# Patient Record
Sex: Female | Born: 1995 | Race: White | Hispanic: No | Marital: Single | Smoking: Current every day smoker
Health system: Southern US, Community
[De-identification: ages and names within clinical notes are randomized; demographics above are authoritative.]

## PROBLEM LIST (undated history)

## (undated) DIAGNOSIS — F319 Bipolar disorder, unspecified: Secondary | ICD-10-CM

## (undated) DIAGNOSIS — G479 Sleep disorder, unspecified: Secondary | ICD-10-CM

---

## 2017-05-04 ENCOUNTER — Other Ambulatory Visit: Payer: Self-pay

## 2017-05-04 ENCOUNTER — Ambulatory Visit (HOSPITAL_COMMUNITY)
Admission: EM | Admit: 2017-05-04 | Discharge: 2017-05-04 | Disposition: A | Discharge status: Home / Self Care | Payer: Medicare Other | Attending: Emergency Medicine | Admitting: Emergency Medicine

## 2017-05-04 ENCOUNTER — Encounter (HOSPITAL_COMMUNITY): Payer: Self-pay

## 2017-05-04 DIAGNOSIS — Z113 Encounter for screening for infections with a predominantly sexual mode of transmission: Secondary | ICD-10-CM

## 2017-05-04 DIAGNOSIS — Z202 Contact with and (suspected) exposure to infections with a predominantly sexual mode of transmission: Secondary | ICD-10-CM | POA: Diagnosis not present

## 2017-05-04 DIAGNOSIS — N926 Irregular menstruation, unspecified: Secondary | ICD-10-CM | POA: Insufficient documentation

## 2017-05-04 DIAGNOSIS — F17210 Nicotine dependence, cigarettes, uncomplicated: Secondary | ICD-10-CM | POA: Insufficient documentation

## 2017-05-04 DIAGNOSIS — N898 Other specified noninflammatory disorders of vagina: Secondary | ICD-10-CM | POA: Insufficient documentation

## 2017-05-04 DIAGNOSIS — Z79899 Other long term (current) drug therapy: Secondary | ICD-10-CM | POA: Diagnosis not present

## 2017-05-04 HISTORY — DX: Bipolar disorder, unspecified: F31.9

## 2017-05-04 HISTORY — DX: Sleep disorder, unspecified: G47.9

## 2017-05-04 LAB — POCT PREGNANCY, URINE: Preg Test, Ur: NEGATIVE

## 2017-05-04 LAB — POCT URINALYSIS DIP (DEVICE)
Bilirubin Urine: NEGATIVE
Glucose, UA: NEGATIVE mg/dL
Ketones, ur: NEGATIVE mg/dL
NITRITE: NEGATIVE
PH: 7.5 (ref 5.0–8.0)
PROTEIN: NEGATIVE mg/dL
Specific Gravity, Urine: 1.02 (ref 1.005–1.030)
Urobilinogen, UA: 0.2 mg/dL (ref 0.0–1.0)

## 2017-05-04 MED ORDER — AZITHROMYCIN 250 MG PO TABS
1000.0000 mg | ORAL_TABLET | Freq: Once | ORAL | Status: AC
  Administered 2017-05-04: 1000 mg via ORAL

## 2017-05-04 MED ORDER — METRONIDAZOLE 500 MG PO TABS: 500.0000 mg | ORAL_TABLET | Freq: Two times a day (BID) | ORAL | 0 refills | Status: AC

## 2017-05-04 MED ORDER — CEFTRIAXONE SODIUM 250 MG IJ SOLR
250.0000 mg | Freq: Once | INTRAMUSCULAR | Status: AC
  Administered 2017-05-04: 250 mg via INTRAMUSCULAR

## 2017-05-04 MED ORDER — LIDOCAINE HCL (PF) 1 % IJ SOLN
INTRAMUSCULAR | Status: AC
  Filled 2017-05-04: qty 2

## 2017-05-04 MED ORDER — CEFTRIAXONE SODIUM 250 MG IJ SOLR
INTRAMUSCULAR | Status: AC
  Filled 2017-05-04: qty 250

## 2017-05-04 MED ORDER — AZITHROMYCIN 250 MG PO TABS
ORAL_TABLET | ORAL | Status: AC
  Filled 2017-05-04: qty 4

## 2017-05-04 NOTE — ED Triage Notes (Signed)
Pt presents with yellow vaginal discharge and irritation x 1 month. Reports last time she had gonorrhea.

## 2017-05-04 NOTE — ED Provider Notes (Signed)
[facility]    CSN: [id number] Arrival date & time: 05/04/17  1001     History   Chief Complaint Chief Complaint  Patient presents with  . Vaginal Itching    HPI Lindsey Ramos is a 22 y.o. female.   22 year old female presents with yellowish vaginal discharge and itching for over 1 month. Also having a foul odor but denies any abdominal pain, pelvic pain, dysuria or unusual bleeding. She has a history of irregular periods and has not had a period since September 2018. She is also bipolar and on multiple medications for treatment. She has had 5 partners in the past 3 months and occasionally uses condoms. She has a history of gonorrhea/chlamydia in Jan 2019. She took 4 pills which cleared up her symptoms. She also had a pap smear in March. She has not taken any OTC medications for her current symptoms.   The history is provided by the patient.    Past Medical History:  Diagnosis Date  . Bipolar affective disorder (HCC)   . Sleep disorder     There are no active problems to display for this patient.   History reviewed. No pertinent surgical history.  OB History   None      Home Medications    Prior to Admission medications   Medication Sig Start Date End Date Taking? Authorizing Provider  diphenhydrAMINE (BENADRYL) 50 MG capsule Take 50 mg by mouth at bedtime as needed.   Yes [provider]  mirtazapine (REMERON) 30 MG tablet Take 30 mg by mouth at bedtime.   Yes [provider]  paliperidone (INVEGA SUSTENNA) 234 MG/1.5ML SUSY injection Inject 234 mg into the muscle once.   Yes [provider]  sertraline (ZOLOFT) 100 MG tablet Take 100 mg by mouth daily.   Yes [provider]  metroNIDAZOLE (FLAGYL) 500 MG tablet Take 1 tablet (500 mg total) by mouth 2 (two) times daily for 7 days. 05/04/17 05/11/17  Sudie Grumbling, NP    Family History Family History  Problem Relation Age of Onset  . Healthy Mother   . Healthy  Father     Social History Social History   Tobacco Use  . Smoking status: Current Every Day Smoker    Packs/day: 0.50    Years: 5.00    Pack years: 2.50    Types: Cigarettes  . Smokeless tobacco: Never Used  Substance Use Topics  . Alcohol use: Never    Frequency: Never  . Drug use: Never     Allergies   Patient has no known allergies.   Review of Systems Review of Systems  Constitutional: Negative for activity change, appetite change, chills, fatigue and fever.  HENT: Negative for mouth sores, sore throat and trouble swallowing.   Respiratory: Negative for cough, chest tightness and shortness of breath.   Gastrointestinal: Negative for abdominal pain, blood in stool, constipation, diarrhea, nausea and vomiting.  Genitourinary: Positive for menstrual problem and vaginal discharge. Negative for decreased urine volume, difficulty urinating, dysuria, flank pain, frequency, genital sores, hematuria, pelvic pain, urgency, vaginal bleeding and vaginal pain.  Musculoskeletal: Negative for arthralgias, back pain and myalgias.  Skin: Negative for rash and wound.  Neurological: Negative for dizziness, tremors, seizures, syncope, weakness, light-headedness, numbness and headaches.  Hematological: Negative for adenopathy. Does not bruise/bleed easily.  Psychiatric/Behavioral: Positive for dysphoric mood and sleep disturbance.     Physical Exam Triage Vital Signs ED Triage Vitals  Enc Vitals Group  BP 05/04/17 1024 101/60     Pulse Rate 05/04/17 1024 90     Resp 05/04/17 1024 18     Temp 05/04/17 1024 98.7 F (37.1 C)     Temp src --      SpO2 05/04/17 1024 94 %     Weight 05/04/17 1025 180 lb (81.6 kg)     Height --      Head Circumference --      Peak Flow --      Pain Score 05/04/17 1025 0     Pain Loc --      Pain Edu? --      Excl. in GC? --    No data found.  Updated Vital Signs BP 101/60   Pulse 90   Temp 98.7 F (37.1 C)   Resp 18   Wt 180 lb (81.6 kg)    LMP 09/29/2016   SpO2 94%   Visual Acuity Right Eye Distance:   Left Eye Distance:   Bilateral Distance:    Right Eye Near:   Left Eye Near:    Bilateral Near:     Physical Exam  Constitutional: She is oriented to person, place, and time. She appears well-developed and well-nourished. She is cooperative. She does not appear ill. No distress.  Patient sitting on exam table in no acute distress but flat affect.   HENT:  Head: Normocephalic and atraumatic.  Right Ear: External ear normal.  Left Ear: External ear normal.  Mouth/Throat: Oropharynx is clear and moist.  Eyes: Conjunctivae and EOM are normal.  Neck: Normal range of motion.  Cardiovascular: Normal rate, regular rhythm and normal heart sounds.  No murmur heard. Pulmonary/Chest: Effort normal and breath sounds normal. No respiratory distress. She has no decreased breath sounds. She has no wheezes. She has no rhonchi.  Abdominal: Soft. Normal appearance and bowel sounds are normal. There is no tenderness. There is no rigidity, no rebound, no guarding and no CVA tenderness.  Genitourinary:  Genitourinary Comments: Patient declines pelvic exam today  Neurological: She is alert and oriented to person, place, and time.  Skin: Skin is warm and dry. No rash noted.  Psychiatric: Judgment and thought content normal. Her speech is delayed. She is slowed. She exhibits a depressed mood.  Vitals reviewed.    UC Treatments / Results  Labs (all labs ordered are listed, but only abnormal results are displayed) Labs Reviewed  POCT URINALYSIS DIP (DEVICE) - Abnormal; Notable for the following components:      Result Value   Hgb urine dipstick TRACE (*)    Leukocytes, UA LARGE (*)    All other components within normal limits  URINE CULTURE  POCT PREGNANCY, URINE  URINE CYTOLOGY ANCILLARY ONLY    EKG None  Radiology No results found.  Procedures Procedures (including critical care time)  Medications Ordered in  UC Medications  azithromycin (ZITHROMAX) tablet 1,000 mg (1,000 mg Oral Given 05/04/17 1115)  cefTRIAXone (ROCEPHIN) injection 250 mg (250 mg Intramuscular Given 05/04/17 1115)    Initial Impression / Assessment and Plan / UC Course  I have reviewed the triage vital signs and the nursing notes.  Pertinent labs & imaging results that were available during my care of the patient were reviewed by me and considered in my medical decision making (see chart for details).    Reviewed negative pregnancy test with patient. Also reviewed urinalysis results- most likely due to vaginal infection but will send her urine for culture. Discussed that she  may have GC/Chlamydia and/or other STI's. Will treat with Zithromax and Rocephin today. Prescribed Flagyl  twice a day for possible BV. Will add additional medications pending lab results. Discussed no intercourse for at least 10 days. Follow-up pending lab results.  Final Clinical Impressions(s) / UC Diagnoses   Final diagnoses:  Foul smelling vaginal discharge  Potential exposure to STD     Discharge Instructions     You were given a shot of Rocephin (antibiotic) today to treat gonorrhea. You were also given pills (Zithromax) to treat Chlamydia. Recommend start Flagyl  twice a day for 7 days. Take with food and do not drink any alcohol while on medication. No sexual intercourse for at least 10 days. Encouraged to use condoms with each and every future sexual encounter. Follow-up pending lab results.     ED Prescriptions    Medication Sig Dispense Auth. Provider   metroNIDAZOLE (FLAGYL) 500 MG tablet Take 1 tablet (500 mg total) by mouth 2 (two) times daily for 7 days. 14 tablet Sudie Grumbling, NP     Controlled Substance Prescriptions [place] Controlled Substance Registry consulted? Not Applicable   Sudie Grumbling, NP 05/04/17 2100

## 2017-05-04 NOTE — Discharge Instructions (Addendum)
You were given a shot of Rocephin (antibiotic) today to treat gonorrhea. You were also given pills (Zithromax) to treat Chlamydia. Recommend start Flagyl  twice a day for 7 days. Take with food and do not drink any alcohol while on medication. No sexual intercourse for at least 10 days. Encouraged to use condoms with each and every future sexual encounter. Follow-up pending lab results.

## 2017-05-05 LAB — URINE CULTURE: Special Requests: NORMAL

## 2017-05-07 LAB — URINE CYTOLOGY ANCILLARY ONLY
CHLAMYDIA, DNA PROBE: NEGATIVE
NEISSERIA GONORRHEA: NEGATIVE
TRICH (WINDOWPATH): POSITIVE — AB

## 2017-05-08 NOTE — Progress Notes (Signed)
Trichomonas is positive. Rx metronidazole was given at the urgent care visit. Attempted to contact patient to inform of results. No answer, voicemail left. Need to educate patient to please refrain from sexual intercourse for 7 days to give the medicine time to work. Sexual partners need to be notified and tested/treated. Condoms may reduce risk of reinfection. Recheck for further evaluation if symptoms are not improving. Answered all questions.

## 2017-05-09 LAB — URINE CYTOLOGY ANCILLARY ONLY: Candida vaginitis: NEGATIVE

## 2017-05-11 ENCOUNTER — Telehealth (HOSPITAL_COMMUNITY): Payer: Self-pay

## 2017-05-11 NOTE — Telephone Encounter (Signed)
Attempted to reach patient x 2 regarding results. Group home director who is patient medical POA answered and given results of all tests.  Also, patient did test positive Bacterial Vaginosis, this was treated with Flagyl at the urgent care visit.

## 2017-05-14 ENCOUNTER — Emergency Department (HOSPITAL_COMMUNITY): Payer: Medicare Other

## 2017-05-14 ENCOUNTER — Other Ambulatory Visit: Payer: Self-pay

## 2017-05-14 ENCOUNTER — Encounter (HOSPITAL_COMMUNITY): Payer: Self-pay | Admitting: Emergency Medicine

## 2017-05-14 ENCOUNTER — Emergency Department (HOSPITAL_COMMUNITY)
Admission: EM | Admit: 2017-05-14 | Discharge: 2017-05-14 | Disposition: A | Discharge status: Home / Self Care | Payer: Medicare Other | Attending: Emergency Medicine | Admitting: Emergency Medicine

## 2017-05-14 DIAGNOSIS — Y998 Other external cause status: Secondary | ICD-10-CM | POA: Insufficient documentation

## 2017-05-14 DIAGNOSIS — S82851A Displaced trimalleolar fracture of right lower leg, initial encounter for closed fracture: Secondary | ICD-10-CM | POA: Diagnosis not present

## 2017-05-14 DIAGNOSIS — S52501A Unspecified fracture of the lower end of right radius, initial encounter for closed fracture: Secondary | ICD-10-CM | POA: Diagnosis not present

## 2017-05-14 DIAGNOSIS — Y9301 Activity, walking, marching and hiking: Secondary | ICD-10-CM | POA: Diagnosis not present

## 2017-05-14 DIAGNOSIS — F17210 Nicotine dependence, cigarettes, uncomplicated: Secondary | ICD-10-CM | POA: Insufficient documentation

## 2017-05-14 DIAGNOSIS — W108XXA Fall (on) (from) other stairs and steps, initial encounter: Secondary | ICD-10-CM | POA: Diagnosis not present

## 2017-05-14 DIAGNOSIS — S6991XA Unspecified injury of right wrist, hand and finger(s), initial encounter: Secondary | ICD-10-CM | POA: Diagnosis present

## 2017-05-14 DIAGNOSIS — Z79899 Other long term (current) drug therapy: Secondary | ICD-10-CM | POA: Insufficient documentation

## 2017-05-14 DIAGNOSIS — Y929 Unspecified place or not applicable: Secondary | ICD-10-CM | POA: Insufficient documentation

## 2017-05-14 MED ORDER — HYDROCODONE-ACETAMINOPHEN 5-325 MG PO TABS: 1.0000 | ORAL_TABLET | Freq: Four times a day (QID) | ORAL | 0 refills | Status: DC | PRN

## 2017-05-14 MED ORDER — FENTANYL CITRATE (PF) 100 MCG/2ML IJ SOLN
50.0000 ug | Freq: Once | INTRAMUSCULAR | Status: AC
Start: 2017-05-14 — End: 2017-05-14
  Administered 2017-05-14: 50 ug via INTRAVENOUS
  Filled 2017-05-14: qty 2

## 2017-05-14 MED ORDER — HYDROMORPHONE HCL 1 MG/ML IJ SOLN
1.0000 mg | Freq: Once | INTRAMUSCULAR | Status: AC
  Administered 2017-05-14: 1 mg via INTRAVENOUS
  Filled 2017-05-14: qty 1

## 2017-05-14 MED ORDER — PROPOFOL 10 MG/ML IV BOLUS
INTRAVENOUS | Status: AC | PRN
  Administered 2017-05-14 (×2): 30 mg via INTRAVENOUS
  Administered 2017-05-14: 50 mg via INTRAVENOUS
  Administered 2017-05-14: 20 mg via INTRAVENOUS

## 2017-05-14 MED ORDER — PROPOFOL 10 MG/ML IV BOLUS
0.5000 mg/kg | Freq: Once | INTRAVENOUS | Status: DC
  Filled 2017-05-14: qty 20

## 2017-05-14 MED ORDER — SODIUM CHLORIDE 0.9 % IV SOLN
INTRAVENOUS | Status: AC | PRN
  Administered 2017-05-14: 125 mL/h via INTRAVENOUS

## 2017-05-14 NOTE — ED Notes (Signed)
Spoke with Knox Saliva from her group home and she said that the patient is able to legally consent to procedures by herself.

## 2017-05-14 NOTE — Discharge Instructions (Signed)
Call and make an appointment to follow-up closely with the orthopedist.  Will arrange to have home care come to the residents to evaluate for wheelchair.  Keep right leg and arm elevated above your heart.  Take medication as prescribed.

## 2017-05-14 NOTE — ED Triage Notes (Signed)
Per EMS, pt fell up the stairs this afternoon and has an obvious ankle deformity. Right wrist pain. Pt is in splint.

## 2017-05-14 NOTE — Progress Notes (Signed)
This writer present at bedside during conscious sedation procedure.  Nasal cannula applied at 3lpm, HR112, rr16, spo2 100%.  Etco2 in place =41.  Pt tolerated procedure well without incident.

## 2017-05-14 NOTE — ED Provider Notes (Signed)
[facility]EM[facility]ENCY [facility]PT Provider Note   CSN: [id number] Arrival date & time: 05/14/17  1641     History   Chief Complaint Chief Complaint  Patient presents with  . Fall    HPI Lindsey Ramos is a 22 y.o. female.  HPI Patient states she fell down 6 stairs this afternoon.  States she slipped.  Denies any head or neck injury.  No loss of consciousness.  Unable to ambulate after fall.  Patient complains of right ankle and right wrist pain.  Denies any weakness or numbness.  Denies chest pain or abdominal pain.  Splint applied to the wrist and ankle by EMS. Past Medical History:  Diagnosis Date  . Bipolar affective disorder (HCC)   . Sleep disorder     There are no active problems to display for this patient.   History reviewed. No pertinent surgical history.   OB History   None      Home Medications    Prior to Admission medications   Medication Sig Start Date End Date Taking? Authorizing Provider  diphenhydrAMINE (BENADRYL) 50 MG capsule Take 50 mg by mouth at bedtime.    Yes [provider]  mirtazapine (REMERON) 30 MG tablet Take 30 mg by mouth at bedtime.   Yes [provider]  paliperidone (INVEGA SUSTENNA) 234 MG/1.5ML SUSY injection Inject 234 mg into the muscle every 30 (thirty) days.    Yes [provider]  Paliperidone (INVEGA) 1.5 MG TB24 Take 1.5 mg by mouth at bedtime.   Yes [provider]  sertraline (ZOLOFT) 100 MG tablet Take 100 mg by mouth daily.   Yes [provider]  HYDROcodone-acetaminophen (NORCO) 5-325 MG tablet Take 1-2 tablets by mouth every 6 (six) hours as needed for severe pain. 05/14/17   Loren Racer, MD    Family History Family History  Problem Relation Age of Onset  . Healthy Mother   . Healthy Father     Social History Social History   Tobacco Use  . Smoking status: Current Every Day Smoker    Packs/day: 0.50    Years: 5.00    Pack years: 2.50   Types: Cigarettes  . Smokeless tobacco: Never Used  Substance Use Topics  . Alcohol use: Never    Frequency: Never  . Drug use: Never     Allergies   Patient has no known allergies.   Review of Systems Review of Systems  HENT: Negative for facial swelling.   Eyes: Negative for visual disturbance.  Respiratory: Negative for shortness of breath.   Cardiovascular: Negative for chest pain.  Gastrointestinal: Negative for abdominal pain.  Musculoskeletal: Positive for arthralgias and joint swelling. Negative for back pain and neck pain.  Skin: Positive for wound. Negative for rash.  Neurological: Negative for dizziness, syncope, weakness, light-headedness, numbness and headaches.  All other systems reviewed and are negative.    Physical Exam Updated Vital Signs BP 127/77 (BP Location: Left Arm)   Pulse (!) 107   Temp 98.1 F (36.7 C) (Oral)   Resp 20   LMP 09/07/2016   SpO2 98%   Physical Exam  Constitutional: She is oriented to person, place, and time. She appears well-developed and well-nourished. No distress.  HENT:  Head: Normocephalic and atraumatic.  Mouth/Throat: Oropharynx is clear and moist.  No obvious scalp trauma.  Midface is stable.  No intraoral trauma.  Eyes: Pupils are equal, round, and reactive to light. EOM are normal.  Neck: Normal range of motion.  Neck supple.  No posterior midline cervical tenderness to palpation.  Cardiovascular: Normal rate and regular rhythm. Exam reveals no gallop and no friction rub.  No murmur heard. Pulmonary/Chest: Effort normal and breath sounds normal. No stridor. No respiratory distress. She has no wheezes. She has no rales. She exhibits no tenderness.  Abdominal: Soft. Bowel sounds are normal. There is no tenderness. There is no rebound and no guarding.  Musculoskeletal: Normal range of motion. She exhibits tenderness and deformity. She exhibits no edema.  Patient with right wrist tenderness to palpation especially over  the dorsum of the wrist.  There is no obvious swelling or deformity.  She has full range of motion of the fingers, elbow and shoulder without pain, swelling or tenderness.  Distal pulses are 2+.  Patient's pelvis is stable.  Full range of motion of the right hip and right knee without tenderness, swelling or deformity.  Swelling deformity of the right ankle with diffuse tenderness and tenderness of the right foot.  Good distal cap refill.  Small abrasion of the dorsum of the right foot.  Neurological: She is alert and oriented to person, place, and time.  5/5 motor in all extremities.  Sensation fully intact.  Skin: Skin is warm and dry. Capillary refill takes less than 2 seconds. No rash noted. She is not diaphoretic. No erythema.  Psychiatric: Her behavior is normal.  Flat affect.  Does not appear to be in obvious distress.  Nursing note and vitals reviewed.    ED Treatments / Results  Labs (all labs ordered are listed, but only abnormal results are displayed) Labs Reviewed - No data to display  EKG None  Radiology Dg Wrist Complete Right  Result Date: 05/14/2017 CLINICAL DATA:  Fall, pain. EXAM: RIGHT WRIST - COMPLETE 3+ VIEW COMPARISON:  None. FINDINGS: Displaced fracture of the distal RIGHT radius, with fracture extension to the articular surface (ulnar aspect). Approximately 5 mm greatest fracture diastasis. Distal ulna appears intact and normally aligned. Carpal bones appear intact and normally aligned. No dislocation seen at the radiocarpal joint space. IMPRESSION: Displaced fracture of the distal RIGHT radius, with extension to the articular surface (ulnar aspect). Electronically Signed   By: Bary Richard M.D.   On: 05/14/2017 18:37   Dg Ankle Complete Right  Result Date: 05/14/2017 CLINICAL DATA:  Right ankle fracture. EXAM: RIGHT ANKLE - COMPLETE 3+ VIEW COMPARISON:  Radiographs of same day. FINDINGS: Moderately displaced oblique fracture is seen involving the distal right fibula  which is unchanged compared to prior exam. Mildly displaced medial malleolar fracture is noted which is improved compared to prior exam. There appears to be improved alignment of talus compared to distal tibia. IMPRESSION: Status post casting and immobilization of distal tibial and fibular fractures. Electronically Signed   By: Lupita Raider, M.D.   On: 05/14/2017 21:55   Dg Ankle Complete Right  Result Date: 05/14/2017 CLINICAL DATA:  Fall, ankle pain. EXAM: RIGHT ANKLE - COMPLETE 3+ VIEW COMPARISON:  None. FINDINGS: Displaced fracture of the medial malleolus, with associated mild distortion of the ankle mortise (medial and anterior widening). Additional obliquely oriented fracture of the distal fibula, with approximately 4 mm greatest fracture displacement. Visualized portions of the hindfoot and midfoot appear intact and normally aligned. No fracture seen at the talar dome. Expected soft tissue swelling. IMPRESSION: 1. Displaced fracture of the medial malleolus. 2. Displaced fracture of the distal fibula. 3. Distortion of the ankle mortise, with medial and anterior widening, and at least mild  anterior displacement of the tibia relative to the underlying talar dome. Electronically Signed   By: Bary Richard M.D.   On: 05/14/2017 18:35   Dg Foot Complete Right  Result Date: 05/14/2017 CLINICAL DATA:  Fall, pain. EXAM: RIGHT FOOT COMPLETE - 3+ VIEW COMPARISON:  None. FINDINGS: Osseous structures of the RIGHT foot are intact and normally aligned throughout. Soft tissues about the RIGHT foot are unremarkable. Displaced fractures of the medial malleolus and distal fibula, as detailed on the plain film report of the RIGHT ankle. IMPRESSION: 1. No fracture or dislocation within the RIGHT foot. 2. Displaced fractures of the medial malleolus and distal fibula, as detailed on dedicated plain film report of the RIGHT ankle. Electronically Signed   By: Bary Richard M.D.   On: 05/14/2017 18:38     Procedures Reduction of fracture Date/Time: 05/14/2017 9:07 PM Performed by: Loren Racer, MD Authorized by: Loren Racer, MD  Consent: Written consent obtained. Risks and benefits: risks, benefits and alternatives were discussed Consent given by: patient Patient understanding: patient states understanding of the procedure being performed Patient consent: the patient's understanding of the procedure matches consent given Procedure consent: procedure consent matches procedure scheduled Relevant documents: relevant documents present and verified Imaging studies: imaging studies available Patient identity confirmed: arm band and verbally with patient Time out: Immediately prior to procedure a "time out" was called to verify the correct patient, procedure, equipment, support staff and site/side marked as required. Local anesthesia used: no  Anesthesia: Local anesthesia used: no  Sedation: Patient sedated: yes Sedatives: propofol Analgesia: hydromorphone Sedation start date/time: 05/14/2017 9:07 PM Sedation end date/time: 05/14/2017 9:20 PM Vitals: Vital signs were monitored during sedation.  Patient tolerance: Patient tolerated the procedure well with no immediate complications  .Sedation Date/Time: 05/14/2017 9:07 PM Performed by: Loren Racer, MD Authorized by: Loren Racer, MD   Consent:    Consent obtained:  Verbal   Consent given by:  Patient   Risks discussed:  Prolonged sedation necessitating reversal, prolonged hypoxia resulting in organ damage, respiratory compromise necessitating ventilatory assistance and intubation, vomiting and nausea Universal protocol:    Immediately prior to procedure a time out was called: yes   Indications:    Procedure performed:  Fracture reduction   Procedure necessitating sedation performed by:  Physician performing sedation   Intended level of sedation:  Moderate (conscious sedation) Pre-sedation assessment:    Time  since last food or drink:  9 hours   ASA classification: class 1 - normal, healthy patient     Neck mobility: normal     Mouth opening:  3 or more finger widths   Mallampati score:  I - soft palate, uvula, fauces, pillars visible   Pre-sedation assessments completed and reviewed: airway patency, cardiovascular function, hydration status, mental status, nausea/vomiting, pain level and respiratory function   Immediate pre-procedure details:    Reassessment: Patient reassessed immediately prior to procedure     Reviewed: vital signs     Verified: bag valve mask available, emergency equipment available, intubation equipment available, IV patency confirmed, oxygen available and suction available   Procedure details (see MAR for exact dosages):    Preoxygenation:  Nasal cannula   Sedation:  Propofol   Analgesia:  Hydromorphone   Intra-procedure monitoring:  Blood pressure monitoring, cardiac monitor, continuous pulse oximetry, continuous capnometry, frequent LOC assessments and frequent vital sign checks   Intra-procedure events: none     Total Provider sedation time (minutes):  11 Post-procedure details:    Post-sedation assessment  completed:  05/14/2017 10:20 PM   Attendance: Constant attendance by certified staff until patient recovered     Recovery: Patient returned to pre-procedure baseline     Patient is stable for discharge or admission: yes     Patient tolerance:  Tolerated well, no immediate complications   (including critical care time)  Medications Ordered in ED Medications  propofol (DIPRIVAN) 10 mg/mL bolus/IV push 40.8 mg (has no administration in time range)  fentaNYL (SUBLIMAZE) injection 50 mcg (50 mcg Intravenous Given 05/14/17 1814)  HYDROmorphone (DILAUDID) injection 1 mg (1 mg Intravenous Given 05/14/17 1905)  0.9 %  sodium chloride infusion (125 mL/hr Intravenous New Bag/Given 05/14/17 2100)  propofol (DIPRIVAN) 10 mg/mL bolus/IV push (30 mg Intravenous Given 05/14/17 2115)      Initial Impression / Assessment and Plan / ED Course  I have reviewed the triage vital signs and the nursing notes.  Pertinent labs & imaging results that were available during my care of the patient were reviewed by me and considered in my medical decision making (see chart for details).    Discussed with Dr. Aundria Rud.  Recommends reduction of the right ankle and splinting of both the wrist and ankle.  Follow-up as an outpatient.  Post reduction x-ray with better anatomical alignment of the right ankle mortise.  Given fractures of the right wrist and ankle will arrange for home health evaluation for assistive devices.  Patient understands need to follow-up closely with orthopedics.  Return precautions given. Final Clinical Impressions(s) / ED Diagnoses   Final diagnoses:  Closed fracture of distal end of right radius, unspecified fracture morphology, initial encounter  Closed trimalleolar fracture of right ankle, initial encounter    ED Discharge Orders        Ordered    HYDROcodone-acetaminophen (NORCO) 5-325 MG tablet  Every 6 hours PRN     05/14/17 2217    Home Health     05/14/17 2222    Face-to-face encounter (required for Medicare/Medicaid patients)    Comments:  I Loren Racer certify that this patient is under my care and that I, or a nurse practitioner or physician's assistant working with me, had a face-to-face encounter that meets the physician face-to-face encounter requirements with this patient on 05/14/2017. The encounter with the patient was in whole, or in part for the following medical condition(s) which is the primary reason for home health care (List medical condition): Right wrist fracture, right ankle fracture   05/14/17 2222       Loren Racer, MD 05/14/17 2231

## 2017-05-14 NOTE — ED Notes (Signed)
Patient legal guardian is [employer]. Please call with any information regarding this patient. Number in chart.

## 2017-05-14 NOTE — ED Notes (Signed)
Bed: WA24 Expected date:  Expected time:  Means of arrival:  Comments: EMS ankle deformity 

## 2017-05-14 NOTE — ED Notes (Signed)
Patient group home would like her hydroxyzine 3 times a day.

## 2017-05-22 ENCOUNTER — Other Ambulatory Visit: Payer: Self-pay | Admitting: Orthopedic Surgery

## 2017-05-22 DIAGNOSIS — S82891A Other fracture of right lower leg, initial encounter for closed fracture: Secondary | ICD-10-CM

## 2017-05-22 DIAGNOSIS — M25531 Pain in right wrist: Secondary | ICD-10-CM

## 2017-05-23 ENCOUNTER — Ambulatory Visit
Admission: RE | Admit: 2017-05-23 | Discharge: 2017-05-23 | Disposition: A | Discharge status: Home / Self Care | Payer: Medicaid Other | Source: Ambulatory Visit | Attending: Orthopedic Surgery | Admitting: Orthopedic Surgery

## 2017-05-23 DIAGNOSIS — M25531 Pain in right wrist: Secondary | ICD-10-CM

## 2017-05-23 DIAGNOSIS — S82891A Other fracture of right lower leg, initial encounter for closed fracture: Secondary | ICD-10-CM

## 2017-05-25 ENCOUNTER — Other Ambulatory Visit: Payer: Self-pay

## 2017-05-25 ENCOUNTER — Inpatient Hospital Stay (HOSPITAL_COMMUNITY)
Admission: EM | Admit: 2017-05-25 | Discharge: 2017-06-05 | DRG: 493 | Disposition: A | Discharge status: Home Health | Payer: Medicare Other | Attending: Orthopedic Surgery | Admitting: Orthopedic Surgery

## 2017-05-25 ENCOUNTER — Encounter (HOSPITAL_COMMUNITY): Payer: Self-pay | Admitting: Emergency Medicine

## 2017-05-25 DIAGNOSIS — S52501A Unspecified fracture of the lower end of right radius, initial encounter for closed fracture: Secondary | ICD-10-CM | POA: Diagnosis present

## 2017-05-25 DIAGNOSIS — S82851B Displaced trimalleolar fracture of right lower leg, initial encounter for open fracture type I or II: Secondary | ICD-10-CM | POA: Diagnosis not present

## 2017-05-25 DIAGNOSIS — R2681 Unsteadiness on feet: Secondary | ICD-10-CM

## 2017-05-25 DIAGNOSIS — R45851 Suicidal ideations: Secondary | ICD-10-CM | POA: Diagnosis not present

## 2017-05-25 DIAGNOSIS — F319 Bipolar disorder, unspecified: Secondary | ICD-10-CM | POA: Diagnosis present

## 2017-05-25 DIAGNOSIS — Z79899 Other long term (current) drug therapy: Secondary | ICD-10-CM

## 2017-05-25 DIAGNOSIS — W19XXXA Unspecified fall, initial encounter: Secondary | ICD-10-CM | POA: Diagnosis present

## 2017-05-25 DIAGNOSIS — F17210 Nicotine dependence, cigarettes, uncomplicated: Secondary | ICD-10-CM | POA: Diagnosis present

## 2017-05-25 DIAGNOSIS — S52571A Other intraarticular fracture of lower end of right radius, initial encounter for closed fracture: Secondary | ICD-10-CM | POA: Diagnosis present

## 2017-05-25 DIAGNOSIS — S82851A Displaced trimalleolar fracture of right lower leg, initial encounter for closed fracture: Secondary | ICD-10-CM | POA: Diagnosis present

## 2017-05-25 LAB — COMPREHENSIVE METABOLIC PANEL
ALT: 13 U/L — ABNORMAL LOW (ref 14–54)
AST: 17 U/L (ref 15–41)
Albumin: 4 g/dL (ref 3.5–5.0)
Alkaline Phosphatase: 86 U/L (ref 38–126)
Anion gap: 10 (ref 5–15)
BUN: 9 mg/dL (ref 6–20)
CHLORIDE: 104 mmol/L (ref 101–111)
CO2: 24 mmol/L (ref 22–32)
CREATININE: 0.98 mg/dL (ref 0.44–1.00)
Calcium: 9.1 mg/dL (ref 8.9–10.3)
Glucose, Bld: 107 mg/dL — ABNORMAL HIGH (ref 65–99)
POTASSIUM: 3.5 mmol/L (ref 3.5–5.1)
Sodium: 138 mmol/L (ref 135–145)
TOTAL PROTEIN: 7.4 g/dL (ref 6.5–8.1)
Total Bilirubin: 0.8 mg/dL (ref 0.3–1.2)

## 2017-05-25 LAB — RAPID URINE DRUG SCREEN, HOSP PERFORMED
AMPHETAMINES: NOT DETECTED
Barbiturates: NOT DETECTED
Benzodiazepines: NOT DETECTED
Cocaine: NOT DETECTED
OPIATES: NOT DETECTED
Tetrahydrocannabinol: NOT DETECTED

## 2017-05-25 LAB — CBC
HCT: 37.6 % (ref 36.0–46.0)
Hemoglobin: 12.5 g/dL (ref 12.0–15.0)
MCH: 30.7 pg (ref 26.0–34.0)
MCHC: 33.2 g/dL (ref 30.0–36.0)
MCV: 92.4 fL (ref 78.0–100.0)
PLATELETS: 353 10*3/uL (ref 150–400)
RBC: 4.07 MIL/uL (ref 3.87–5.11)
RDW: 12.9 % (ref 11.5–15.5)
WBC: 9.5 10*3/uL (ref 4.0–10.5)

## 2017-05-25 LAB — SALICYLATE LEVEL

## 2017-05-25 LAB — ETHANOL: Alcohol, Ethyl (B): <10 mg/dL (ref <10)

## 2017-05-25 LAB — ACETAMINOPHEN LEVEL: Acetaminophen (Tylenol), Serum: <10 ug/mL — ABNORMAL LOW (ref 10–30)

## 2017-05-25 LAB — I-STAT BETA HCG BLOOD, ED (MC, WL, AP ONLY): I-stat hCG, quantitative: <5 m[IU]/mL (ref <5)

## 2017-05-25 MED ORDER — NICOTINE 21 MG/24HR TD PT24
21.0000 mg | MEDICATED_PATCH | Freq: Once | TRANSDERMAL | Status: AC
  Administered 2017-05-25: 21 mg via TRANSDERMAL
  Filled 2017-05-25: qty 1

## 2017-05-25 MED ORDER — HYDROCODONE-ACETAMINOPHEN 5-325 MG PO TABS
1.0000 | ORAL_TABLET | Freq: Once | ORAL | Status: AC
  Administered 2017-05-25: 1 via ORAL
  Filled 2017-05-25: qty 1

## 2017-05-25 MED ORDER — NICOTINE 14 MG/24HR TD PT24
14.0000 mg | MEDICATED_PATCH | Freq: Once | TRANSDERMAL | Status: AC
  Administered 2017-05-25: 14 mg via TRANSDERMAL
  Filled 2017-05-25: qty 1

## 2017-05-25 NOTE — Progress Notes (Signed)
CSW requested and received a copy of the pt's guardianship paperwork from the pt's Legal Guardian Kepe Harrisonfrom [employer] for [phone] and placed two copies into the pt's chart.  Please reconsult if future social work needs arise.  CSW signing off, as social work intervention is no longer needed.  Dorothe Pea. Hermenia Fritcher, LCSW, LCAS, CSI Clinical Social Worker Ph: [phone]
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
## 2017-05-25 NOTE — ED Notes (Signed)
Bed: WA29 Expected date:  Expected time:  Means of arrival:  Comments: Hold for ems
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
## 2017-05-25 NOTE — ED Triage Notes (Signed)
Pt presented from [facility]. She hates it there. She said, "I hate everybody here." Her legal guardian is Marcene Corning from [facility] [phone]. With her injuries at present they can no longer take care of her. There is a hard cast on her right forearm and right lower leg. [facility] did not send her crutches. Police were on the scene and called EMS because they could not get her down the stairs to bring her here. She told the police that she is suicidal, but she told EMS that she lied because she wants to be placed in a different facility. The broken arm and leg occurred when she feel down stairs. Pt is A&O.

## 2017-05-25 NOTE — ED Notes (Signed)
Pt alert x4 resident of a local group home. States that she does not like the staff at the group home and wants to leave. Pt has a soft cast on rt forearm and rt lower leg, pt is able to wiggle both finger and toes, both fingers and toes are warm to touch. I will continue to monitor.

## 2017-05-25 NOTE — Progress Notes (Addendum)
UPDATE: CSW spoke with pt's EDP and RN ortho had seen the pt earlier and recommended PT see the pt in the morning for a recommendation on any additional needs (medical equipment, etc) the pt may use at D/C to assist the pt.  CSW will update the pt's [facility] Owner and L.G.  2nd shift ED CSW will leave handoff for 1st shift ED CSW.  8:55 PM CSW updated  the pt's [facility] Owner and L.G. And told them they would be updated in the morning by either the RN or the CSW.  CSW asked Miss Romeo Apple to please fax the pt's guardianship papers to [phone] (CSW FAX MACHINE) and Miss Romeo Apple voiced understanding and was agreeable.  Please reconsult if future social work needs arise.  CSW signing off, as social work intervention is no longer needed.  Dorothe Pea. Raunel Dimartino, LCSW, LCAS, CSI Clinical Social Worker Ph: [phone]
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
## 2017-05-25 NOTE — Progress Notes (Addendum)
CSW called Pt's legal guardian, Marcene Corning from [facility] [facility] [employer] [phone] and pt's group home with owner Knox Saliva, [facility] at ph: [phone] and advised them pt is ready for D/C and that the EDP is clearing pt medically at this time.  CSW advised both pt will be returning by PTAR to the [facility] and both were agreeable.  CSW acted as mediator to both and provided active listening to both parties and spoke with both parties regarding issues both parties ahd with each other and afterwards both were agreeble to pt returning to the group home.  CSW advised GH owner Knox Saliva, [facility] at ph: [phone] that RN will update her when pt is leaving by PTAR.  CSW will update RN.  EDP is clearing pt at this time.  Please reconsult if future social work needs arise.  CSW signing off, as social work intervention is no longer needed.  Dorothe Pea. Dmarius Reeder, LCSW, LCAS, CSI Clinical Social Worker Ph: [phone]

## 2017-05-25 NOTE — Progress Notes (Signed)
CSW spoke to the EDP who stated pt is to complete one final consult (if needed) and that then the pt will be medically cleared.  CSW will update RN and request that the RN to update the CS when pt is ready for D/C.  CSW will continue to follow for D/C needs.  Lindsey Ramos. Demiya Magno, LCSW, LCAS, CSI Clinical Social Worker Ph: [phone]

## 2017-05-26 DIAGNOSIS — F319 Bipolar disorder, unspecified: Secondary | ICD-10-CM | POA: Diagnosis present

## 2017-05-26 DIAGNOSIS — W19XXXA Unspecified fall, initial encounter: Secondary | ICD-10-CM | POA: Diagnosis not present

## 2017-05-26 DIAGNOSIS — Z79899 Other long term (current) drug therapy: Secondary | ICD-10-CM | POA: Diagnosis not present

## 2017-05-26 DIAGNOSIS — S52571A Other intraarticular fracture of lower end of right radius, initial encounter for closed fracture: Secondary | ICD-10-CM | POA: Diagnosis present

## 2017-05-26 DIAGNOSIS — S82851B Displaced trimalleolar fracture of right lower leg, initial encounter for open fracture type I or II: Secondary | ICD-10-CM | POA: Diagnosis present

## 2017-05-26 DIAGNOSIS — R45851 Suicidal ideations: Secondary | ICD-10-CM | POA: Diagnosis present

## 2017-05-26 DIAGNOSIS — F17210 Nicotine dependence, cigarettes, uncomplicated: Secondary | ICD-10-CM | POA: Diagnosis present

## 2017-05-26 DIAGNOSIS — S82851A Displaced trimalleolar fracture of right lower leg, initial encounter for closed fracture: Secondary | ICD-10-CM | POA: Diagnosis present

## 2017-05-26 MED ORDER — HYDROCODONE-ACETAMINOPHEN 5-325 MG PO TABS
1.0000 | ORAL_TABLET | ORAL | Status: DC | PRN
  Administered 2017-05-26 (×3): 1 via ORAL
  Administered 2017-05-27 – 2017-06-04 (×29): 2 via ORAL
  Filled 2017-05-26 (×14): qty 2
  Filled 2017-05-26: qty 1
  Filled 2017-05-26 (×5): qty 2
  Filled 2017-05-26: qty 1
  Filled 2017-05-26 (×9): qty 2
  Filled 2017-05-26: qty 1
  Filled 2017-05-26 (×2): qty 2

## 2017-05-26 MED ORDER — METHOCARBAMOL 1000 MG/10ML IJ SOLN
500.0000 mg | Freq: Four times a day (QID) | INTRAVENOUS | Status: DC | PRN
  Filled 2017-05-26: qty 5

## 2017-05-26 MED ORDER — IBUPROFEN 200 MG PO TABS
600.0000 mg | ORAL_TABLET | Freq: Four times a day (QID) | ORAL | Status: DC | PRN
  Administered 2017-05-27 – 2017-06-04 (×6): 600 mg via ORAL
  Filled 2017-05-26 (×8): qty 3

## 2017-05-26 MED ORDER — ACETAMINOPHEN 650 MG RE SUPP: 650.0000 mg | Freq: Four times a day (QID) | RECTAL | Status: DC | PRN

## 2017-05-26 MED ORDER — NICOTINE 14 MG/24HR TD PT24
14.0000 mg | MEDICATED_PATCH | Freq: Once | TRANSDERMAL | Status: AC
  Administered 2017-05-26: 14 mg via TRANSDERMAL
  Filled 2017-05-26: qty 1

## 2017-05-26 MED ORDER — LIP MEDEX EX OINT
TOPICAL_OINTMENT | CUTANEOUS | Status: AC
  Filled 2017-05-26: qty 7

## 2017-05-26 MED ORDER — ACETAMINOPHEN 325 MG PO TABS
650.0000 mg | ORAL_TABLET | Freq: Four times a day (QID) | ORAL | Status: DC | PRN
  Administered 2017-05-30 – 2017-06-05 (×7): 650 mg via ORAL
  Filled 2017-05-26 (×8): qty 2

## 2017-05-26 MED ORDER — MUPIROCIN 2 % EX OINT
1.0000 "application " | TOPICAL_OINTMENT | Freq: Two times a day (BID) | CUTANEOUS | Status: AC
  Administered 2017-05-26 – 2017-05-28 (×3): 1 via NASAL
  Filled 2017-05-26: qty 22

## 2017-05-26 MED ORDER — HYDROCODONE-ACETAMINOPHEN 5-325 MG PO TABS: 1.0000 | ORAL_TABLET | Freq: Four times a day (QID) | ORAL | Status: DC | PRN

## 2017-05-26 MED ORDER — METHOCARBAMOL 500 MG PO TABS
500.0000 mg | ORAL_TABLET | Freq: Four times a day (QID) | ORAL | Status: DC | PRN
  Administered 2017-05-26 – 2017-06-05 (×23): 500 mg via ORAL
  Filled 2017-05-26 (×23): qty 1

## 2017-05-26 NOTE — ED Notes (Signed)
ICE DECLINED FOR COMFORT

## 2017-05-26 NOTE — Evaluation (Signed)
Physical Therapy Evaluation Patient Details Name: Lindsey Ramos MRN: [id number] DOB: March 30, 1995 Today's Date: 05/26/2017   History of Present Illness  22 yo female presented to ED with suicidal ideation and complaints about her living environment. Hx of bipolar d/o and fall on or around 05/14/17. Imaging (+) R trimalleolar ankle fx, R comminuted distal radius fx, and R minimally displaced ulnar fx. Pt is from a group home.     Clinical Impression  On eval, pt required Min assist for mobility. She was able to walk ~20 feet with a R PFRW. She was also able to perform a squat pivot, bed to wheelchair. Pt rated pain 9/10-RN made aware. Discussed d/c plan-pt is currently unsure of her d/c plan at time of eval. She stated she had been using crutches to get around prior to ED visit/admission. Explained to pt that she should be adhering to NWB precautions for both her R wrist and her R ankle, and that crutches were not the safest device for her to use. Towards end of session, MD entered to assess pt and mentioned she would be transferred to [facility] for surgery. Will plan to continue to follow/re-evaluate once pt is medically stable s/p surgery and per surgeon recommendations.     Follow Up Recommendations SNF vs Home health PT(depending on progress. Pt to be transferred to [facility] for surgery possibly)    Equipment Recommendations  Wheelchair ;Rolling walker with 5" wheels (with R platform)??? Continuing to assess...   Recommendations for Other Services       Precautions / Restrictions Precautions Precautions: Fall Required Braces or Orthoses: Other Brace/Splint Other Brace/Splint: splint/cast R arm and R lower leg Restrictions Weight Bearing Restrictions: Yes RUE Weight Bearing: Non weight bearing RLE Weight Bearing: Non weight bearing      Mobility  Bed Mobility Overal bed mobility: Modified Independent                Transfers Overall transfer level: Needs assistance Equipment used:  Right platform walker;Wheelchair Transfers: Sit to/from [employer] Sit to Stand: Min assist   Squat pivot transfers: Min guard     General transfer comment: Practiced x 1 with use of R PFRW-Min assist to steady. Practiced x 1 with pivot to wheelchair-Min guard + setup. VCs safety, technique, adherence to NWB status, proper operation of WC  Ambulation/Gait Ambulation/Gait assistance: Min assist Ambulation Distance (Feet): 20 feet with R PFRW; 40 Feet in wheelchair Assistive device: Pushed wheelchair;Right platform walker       General Gait Details: VCS safety, technique, adherence to NWB status with PFRW. Assist to steady intermittently.  Min assist to maneuver safely in wheelchair. Cues for use of L UE to propel and L LE/foot to steer.   Stairs            Wheelchair Mobility    Modified Rankin (Stroke Patients Only)       Balance Overall balance assessment: History of Falls;Needs assistance         Standing balance support: Bilateral upper extremity supported Standing balance-Leahy Scale: Poor                               Pertinent Vitals/Pain Pain Assessment: 0-10 Pain Score: 9  Pain Location: R arm, R ankle Pain Descriptors / Indicators: Aching Pain Intervention(s): Patient requesting pain meds-RN notified    Home Living Family/patient expects to be discharged to:: Unsure  Prior Function Level of Independence: Needs assistance   Gait / Transfers Assistance Needed: pt has been using crutches to get around  ADL's / Homemaking Assistance Needed: assist with bathing, dressing        Hand Dominance        Extremity/Trunk Assessment   Upper Extremity Assessment Upper Extremity Assessment: RUE deficits/detail RUE Deficits / Details: in splint/cast. Pt able to wiggle fingers RUE: Unable to fully assess due to immobilization    Lower Extremity Assessment Lower Extremity Assessment: RLE  deficits/detail RLE Deficits / Details: in splint/cast. Pt able to wiggly toes RLE: Unable to fully assess due to immobilization    Cervical / Trunk Assessment Cervical / Trunk Assessment: Normal  Communication   Communication: No difficulties  Cognition Arousal/Alertness: Awake/alert Behavior During Therapy: WFL for tasks assessed/performed Overall Cognitive Status: Within Functional Limits for tasks assessed                                        General Comments      Exercises     Assessment/Plan    PT Assessment Patient needs continued PT services  PT Problem List Decreased strength;Decreased mobility;Decreased activity tolerance;Decreased balance;Decreased knowledge of use of DME;Pain;Decreased knowledge of precautions;Decreased safety awareness       PT Treatment Interventions DME instruction;Gait training;Therapeutic activities;Therapeutic exercise;Patient/family education;Balance training;Functional mobility training    PT Goals (Current goals can be found in the Care Plan section)  Acute Rehab PT Goals Patient Stated Goal: to find somewhere else to live PT Goal Formulation: With patient Time For Goal Achievement: 06/09/17 Potential to Achieve Goals: Good    Frequency Min 3X/week   Barriers to discharge        Co-evaluation               AM-PAC PT "6 Clicks" Daily Activity  Outcome Measure Difficulty turning over in bed (including adjusting bedclothes, sheets and blankets)?: None Difficulty moving from lying on back to sitting on the side of the bed? : A Little Difficulty sitting down on and standing up from a chair with arms (e.g., wheelchair, bedside commode, etc,.)?: A Little Help needed moving to and from a bed to chair (including a wheelchair)?: A Little Help needed walking in hospital room?: A Little Help needed climbing 3-5 steps with a railing? : Total 6 Click Score: 17    End of Session Equipment Utilized During Treatment:  Gait belt Activity Tolerance: Patient tolerated treatment well Patient left: in bed;with call bell/phone within reach;with nursing/sitter in room   PT Visit Diagnosis: History of falling (Z91.81);Other abnormalities of gait and mobility (R26.89);Difficulty in walking, not elsewhere classified (R26.2);Pain Pain - Right/Left: Right Pain - part of body: Arm;Ankle and joints of foot    Time: 1610-9604 PT Time Calculation (min) (ACUTE ONLY): 19 min   Charges:   PT Evaluation $PT Eval Moderate Complexity: 1 Mod     PT G Codes:          Rebeca Alert, MPT Pager: [phone]

## 2017-05-26 NOTE — ED Notes (Signed)
MAIN LAB HERE FOR LABS

## 2017-05-26 NOTE — ED Notes (Signed)
ED TO INPATIENT HANDOFF REPORT  Name/Age/Gender Lindsey Ramos 22 y.o. female  Code Status    Code Status Orders  (From admission, onward)        Start     Ordered   05/26/17 0929  Full code  Continuous     05/26/17 0930    Code Status History    This patient has a current code status but no historical code status.      Home/SNF/Other Home  Chief Complaint suicidal  Level of Care/Admitting Diagnosis ED Disposition    ED Disposition Condition Comment   Admit  Hospital Area: [place] [[id number]]  Level of Care: Med-Surg [16]  Diagnosis: Closed displaced trimalleolar fracture of right ankle [[id number]]  Admitting Physician: Nicholes Stairs [[id number]]  Attending Physician: Nicholes Stairs [[id number]]  Estimated length of stay: past midnight tomorrow  Certification:: I certify this patient will need inpatient services for at least 2 midnights  PT Class (Do Not Modify): Inpatient [101]  PT Acc Code (Do Not Modify): Private [1]       Medical History Past Medical History:  Diagnosis Date  . Bipolar affective disorder ([place])   . Sleep disorder     Allergies No Known Allergies  IV Location/Drains/Wounds Patient Lines/Drains/Airways Status   Active Line/Drains/Airways    None          Labs/Imaging Results for orders placed or performed during the hospital encounter of 05/25/17 (from the past 48 hour(s))  Comprehensive metabolic panel     Status: Abnormal   Collection Time: 05/25/17  3:44 PM  Result Value Ref Range   Sodium 138 135 - 145 mmol/L   Potassium 3.5 3.5 - 5.1 mmol/L   Chloride 104 101 - 111 mmol/L   CO2 24 22 - 32 mmol/L   Glucose, Bld 107 (H) 65 - 99 mg/dL   BUN 9 6 - 20 mg/dL   Creatinine, Ser 0.98 0.44 - 1.00 mg/dL   Calcium 9.1 8.9 - 10.3 mg/dL   Total Protein 7.4 6.5 - 8.1 g/dL   Albumin 4.0 3.5 - 5.0 g/dL   AST 17 15 - 41 U/L   ALT 13 (L) 14 - 54 U/L   Alkaline Phosphatase 86 38 - 126 U/L   Total Bilirubin  0.8 0.3 - 1.2 mg/dL   GFR calc non Af Amer >60 >60 mL/min   GFR calc Af Amer >60 >60 mL/min    Comment: (NOTE) The eGFR has been calculated using the CKD EPI equation. This calculation has not been validated in all clinical situations. eGFR's persistently <60 mL/min signify possible Chronic Kidney Disease.    Anion gap 10 5 - 15    Comment: Performed at [facility], [place] [address] [place], [place] [address]  Ethanol     Status: None   Collection Time: 05/25/17  3:44 PM  Result Value Ref Range   Alcohol, Ethyl (B) <10 <10 mg/dL    Comment: (NOTE) Lowest detectable limit for serum alcohol is 10 mg/dL. For medical purposes only. Performed at [facility], [place] [address] [place], [place] [address]   Salicylate level     Status: None   Collection Time: 05/25/17  3:44 PM  Result Value Ref Range   Salicylate Lvl <6.1 2.8 - 30.0 mg/dL    Comment: Performed at [facility], [place] [address] [place], [place] [address]  Acetaminophen level     Status: Abnormal   Collection Time: 05/25/17  3:44 PM  Result Value Ref Range   Acetaminophen (Tylenol), Serum <10 (L) 10 - 30 ug/mL    Comment: (NOTE) Therapeutic concentrations vary significantly. A range of 10-30 ug/mL  may be an effective concentration for many patients. However, some  are best treated at concentrations outside of this range. Acetaminophen concentrations >150 ug/mL at 4 hours after ingestion  and >50 ug/mL at 12 hours after ingestion are often associated with  toxic reactions. Performed at [facility], [place] [address], [place], [place] [address]   cbc     Status: None   Collection Time: 05/25/17  3:44 PM  Result Value Ref Range   WBC 9.5 4.0 - 10.5 K/uL   RBC 4.07 3.87 - 5.11 MIL/uL   Hemoglobin 12.5 12.0 - 15.0 g/dL   HCT 37.6 36.0 - 46.0 %   MCV 92.4 78.0 - 100.0 fL   MCH 30.7 26.0 - 34.0 pg   MCHC 33.2 30.0 - 36.0 g/dL   RDW 12.9 11.5 -  15.5 %   Platelets 353 150 - 400 K/uL    Comment: Performed at [facility], [place] [address] [place], [place] [address]  I-Stat beta hCG blood, ED     Status: None   Collection Time: 05/25/17  3:51 PM  Result Value Ref Range   I-stat hCG, quantitative <5.0 <5 mIU/mL   Comment 3            Comment:   GEST. AGE      CONC.  (mIU/mL)   <=1 WEEK        5 - 50     2 WEEKS       50 - 500     3 WEEKS       100 - 10,000     4 WEEKS     1,000 - 30,000        FEMALE AND NON-PREGNANT FEMALE:     LESS THAN 5 mIU/mL   Rapid urine drug screen (hospital performed)     Status: None   Collection Time: 05/25/17  4:42 PM  Result Value Ref Range   Opiates NONE DETECTED NONE DETECTED   Cocaine NONE DETECTED NONE DETECTED   Benzodiazepines NONE DETECTED NONE DETECTED   Amphetamines NONE DETECTED NONE DETECTED   Tetrahydrocannabinol NONE DETECTED NONE DETECTED   Barbiturates NONE DETECTED NONE DETECTED    Comment: (NOTE) DRUG SCREEN FOR MEDICAL PURPOSES ONLY.  IF CONFIRMATION IS NEEDED FOR ANY PURPOSE, NOTIFY LAB WITHIN 5 DAYS. LOWEST DETECTABLE LIMITS FOR URINE DRUG SCREEN Drug Class                     Cutoff (ng/mL) Amphetamine and metabolites    1000 Barbiturate and metabolites    200 Benzodiazepine                 163 Tricyclics and metabolites     300 Opiates and metabolites        300 Cocaine and metabolites        300 THC                            50 Performed at [facility], [place] [address] [place],  [address]    No results found.  Pending Labs Unresulted Labs (From admission, onward)   Start     Ordered   05/26/17 0929  HIV antibody (Routine Testing)  Once,   R     05/26/17 0930      Vitals/Pain Today's Vitals   05/25/17 2210 05/26/17 0617 05/26/17 0844 05/26/17 1404  BP: (!) 100/59 125/81  (!) 101/51  Pulse: 69 72  82  Resp: 16 16  16   Temp: 98.2 F (36.8 C) 98.6 F (37 C)  98.4 F (36.9 C)  TempSrc: Oral Oral  Oral   SpO2: 99% 96%  100%  Weight:      Height:      PainSc:   1      Isolation Precautions No active isolations  Medications Medications  nicotine (NICODERM CQ - dosed in mg/24 hours) patch 21 mg (21 mg Transdermal Patch Applied 05/25/17 1838)  nicotine (NICODERM CQ - dosed in mg/24 hours) patch 14 mg (14 mg Transdermal Patch Applied 05/25/17 2057)  acetaminophen (TYLENOL) tablet 650 mg (has no administration in time range)    Or  acetaminophen (TYLENOL) suppository 650 mg (has no administration in time range)  HYDROcodone-acetaminophen (NORCO/VICODIN) 5-325 MG per tablet 1-2 tablet (1 tablet Oral Given 05/26/17 1022)  methocarbamol (ROBAXIN) tablet 500 mg (500 mg Oral Given 05/26/17 1022)    Or  methocarbamol (ROBAXIN) 500 mg in dextrose 5 % 50 mL IVPB ( Intravenous See Alternative 05/26/17 1022)  ibuprofen (ADVIL,MOTRIN) tablet 600 mg (has no administration in time range)  lip balm (CARMEX) ointment (1 application  Not Given 07/26/48 1023)  nicotine (NICODERM CQ - dosed in mg/24 hours) patch 14 mg (14 mg Transdermal Patch Applied 05/26/17 1108)  HYDROcodone-acetaminophen (NORCO/VICODIN) 5-325 MG per tablet 1 tablet (1 tablet Oral Given 05/25/17 2054)    Mobility walks with device

## 2017-05-26 NOTE — H&P (Signed)
ORTHOPAEDIC H and P  REQUESTING PHYSICIAN: Yolonda Kida, MD  PCP:  Patient, No Pcp Per  Chief Complaint: Right ankle and right wrist fracture  HPI:  Lindsey Ramos is a 22 y.o. female who complains of right wrist and right ankle pain following a fall back on May 17.  She was assessed in my clinic where we had recommended a CT scan of both the wrist and ankle.  Unfortunately last evening she presented to the emergency department following some gait instability as well as report of suicidal ideation.  The psychiatric service has cleared her for return back to her group home.  She does need the ankle and wrist addressed surgically so we have recommended admission for operative fixation of these injuries tomorrow.  She states her pain is mild and well-controlled at this time.  She has been wearing the splint on the right wrist and right ankle.  Past Medical History:  Diagnosis Date  . Bipolar affective disorder (HCC)   . Sleep disorder    History reviewed. No pertinent surgical history. Social History   Socioeconomic History  . Marital status: Single    Spouse name: Not on file  . Number of children: Not on file  . Years of education: Not on file  . Highest education level: Not on file  Occupational History  . Not on file  Social Needs  . Financial resource strain: Not on file  . Food insecurity:    Worry: Not on file    Inability: Not on file  . Transportation needs:    Medical: Not on file    Non-medical: Not on file  Tobacco Use  . Smoking status: Current Every Day Smoker    Packs/day: 0.50    Years: 5.00    Pack years: 2.50    Types: Cigarettes  . Smokeless tobacco: Never Used  Substance and Sexual Activity  . Alcohol use: Never    Frequency: Never  . Drug use: Never  . Sexual activity: Yes  Lifestyle  . Physical activity:    Days per week: Not on file    Minutes per session: Not on file  . Stress: Not on file  Relationships  . Social connections:      Talks on phone: Not on file    Gets together: Not on file    Attends religious service: Not on file    Active member of club or organization: Not on file    Attends meetings of clubs or organizations: Not on file    Relationship status: Not on file  Other Topics Concern  . Not on file  Social History Narrative  . Not on file   Family History  Problem Relation Age of Onset  . Healthy Mother   . Healthy Father    No Known Allergies Prior to Admission medications   Medication Sig Start Date End Date Taking? Authorizing Provider  HYDROcodone-acetaminophen (NORCO) 5-325 MG tablet Take 1-2 tablets by mouth every 6 (six) hours as needed for severe pain. 05/14/17  Yes Loren Racer, MD  mirtazapine (REMERON) 30 MG tablet Take 30 mg by mouth at bedtime.   Yes [provider]  paliperidone (INVEGA SUSTENNA) 234 MG/1.5ML SUSY injection Inject 234 mg into the muscle every 30 (thirty) days.    Yes [provider]  Paliperidone (INVEGA) 1.5 MG TB24 Take 6 mg by mouth at bedtime.    Yes [provider]  sertraline (ZOLOFT) 100 MG tablet Take 100 mg by  mouth daily.   Yes [provider]   No results found.  Positive ROS: All other systems have been reviewed and were otherwise negative with the exception of those mentioned in the HPI and as above.  Physical Exam: General: Alert, no acute distress Cardiovascular: No pedal edema Respiratory: No cyanosis, no use of accessory musculature GI: No organomegaly, abdomen is soft and non-tender Skin: No lesions in the area of chief complaint Neurologic: Sensation intact distally Psychiatric: Patient is competent for consent with normal mood and affect Lymphatic: No axillary or cervical lymphadenopathy  MUSCULOSKELETAL: Right upper extremity splinted with a short arm splint.  This is in good repair.  She is neurovascularly intact throughout the hand and wrist.  No pain with passive stretch. Right lower  extremity:  Short leg splint in place.  She is able to wiggle her toes without pain.  She is neurovascularly intact.  Good capillary refill less than 2 seconds.    Assessment: 1.  Comminuted and displaced right distal radius fracture. 2.  Comminuted right trimalleolar ankle fracture.  Plan: -As I have discussed previously with Margy and her guardian who does provide her healthcare power of attorney privileges she does need surgery at both the right wrist and right ankle.  The right ankle will be addressed by myself.  The right wrist will be addressed by my partner Dr. Bradly Bienenstock who is a hand and upper extremity specialist.  We will plan to perform these procedures tomorrow at [facility].  I have placed orders for admission to the [facility].  She will be n.p.o. tonight at midnight.    Yolonda Kida, MD Cell [phone]    05/26/2017 10:18 AM

## 2017-05-26 NOTE — Progress Notes (Signed)
CSW received a request from the EPD to request permission from the pt's legal guardian for the pt to remain in the [facility] ED, then for the pt to be transported to [facility] for the pt's needed surgeries, per the Ortho consult and the PT consult that was completed in the [facility] ED on 5/25.    Pt's legal guardian Norval Morton [employer] [phone] gave both verbal permission to this Clinical research associate (CSW) and replied in writing to an emailed request for permission sent to the pt's legal guardian from this Clinical research associate on 05/26/17.  Pt's Pt's legal guardian Kepe Harrisonfrom [employer] [phone] is standing by and ready for updates of any kind and for the the pt's need for paperwork to be signed.   CSW requested and received a copy of the pt's guardianship paperwork from the pt's Legal Guardian Kepe Harrisonfrom [employer] [phone] and placed two copies into the pt's chart on 5/24.  CSW will update pt's RN and EDP.  CSW will continue to follow for D/C needs.  Dorothe Pea. Katrinna Travieso, LCSW, LCAS, CSI Clinical Social Worker Ph: [phone]

## 2017-05-26 NOTE — ED Notes (Signed)
EDP J SPEAKING WITH ORTHO MD. PT IS NOW ADMISSION/TRANSFER TO [place] FOR ORTHO SURGERY. EDP J AND SOCIAL WORK JONATHAN WILL CONTACT LEGAL GUARDIAN TO DISCUSS PLAN OF CARE.

## 2017-05-26 NOTE — ED Notes (Signed)
PT PRESENT EVALUATING PT

## 2017-05-26 NOTE — ED Provider Notes (Addendum)
Complains of right ankle pain. Patient is alert awake Glasgow coma score 15. Right lower extremity in splint. Toes are warm. Good color. good capillary refill. She is a little move toes well.Norco ordered   Doug Sou, MD 05/26/17 [phone]   20 5 AM Dr. Aundria Rud, orthopedic surgeon called me requesting patient be transferred to [facility] so that she can undergo surgery on her ankle and wrist tomorrow. He will admit her to the orthopedic service. Patient is in agreement. Social worker contacted by me he will arrange contacting patient's guardian to get permission for surgery.    Doug Sou, MD 05/26/17 [phone]

## 2017-05-26 NOTE — Progress Notes (Signed)
CSW called and advised GH owner Chantay Parker,[facility] ph: [phone] that pt will remain in the [facility] ED today instead of D/C'ing to the [facility] and that then the pt is  to be transported to [facility] for the pt's needed surgeries, per the Ortho consult and the PT consult that was completed in the [facility] ED on 5/25.    PLAN: Pt is to be transported to [facility] for surgery and then when pt is ready for D/C the pt is to return to her group home at [facility] who will need updated at ph: [phone] and the house manager Maren Beach at ph: [phone].  Please reconsult if future social work needs arise.  CSW signing off, as social work intervention is no longer needed.  Dorothe Pea. Brenda Samano, LCSW, LCAS, CSI Clinical Social Worker Ph: [phone]

## 2017-05-26 NOTE — ED Notes (Signed)
ED Provider at bedside. EDP J EVALUATING PT FOR PAIN MEDICATION

## 2017-05-27 ENCOUNTER — Other Ambulatory Visit: Payer: Self-pay

## 2017-05-27 ENCOUNTER — Encounter (HOSPITAL_COMMUNITY): Payer: Self-pay | Admitting: Certified Registered Nurse Anesthetist

## 2017-05-27 ENCOUNTER — Encounter (HOSPITAL_COMMUNITY)
Admission: EM | Disposition: A | Discharge status: Home Health | Payer: Self-pay | Source: Home / Self Care | Attending: Orthopedic Surgery

## 2017-05-27 ENCOUNTER — Inpatient Hospital Stay (HOSPITAL_COMMUNITY): Payer: Medicare Other | Admitting: Certified Registered Nurse Anesthetist

## 2017-05-27 DIAGNOSIS — S52501A Unspecified fracture of the lower end of right radius, initial encounter for closed fracture: Secondary | ICD-10-CM | POA: Diagnosis present

## 2017-05-27 HISTORY — PX: ORIF WRIST FRACTURE: SHX2133

## 2017-05-27 HISTORY — PX: ORIF ANKLE FRACTURE: SHX5408

## 2017-05-27 LAB — CREATININE, SERUM
Creatinine, Ser: 0.75 mg/dL (ref 0.44–1.00)
GFR calc Af Amer: >60 mL/min (ref >60)
GFR calc non Af Amer: >60 mL/min (ref >60)

## 2017-05-27 LAB — CBC
HCT: 40 % (ref 36.0–46.0)
HEMOGLOBIN: 12.7 g/dL (ref 12.0–15.0)
MCH: 29.7 pg (ref 26.0–34.0)
MCHC: 31.8 g/dL (ref 30.0–36.0)
MCV: 93.5 fL (ref 78.0–100.0)
Platelets: 327 10*3/uL (ref 150–400)
RBC: 4.28 MIL/uL (ref 3.87–5.11)
RDW: 12.8 % (ref 11.5–15.5)
WBC: 15 10*3/uL — ABNORMAL HIGH (ref 4.0–10.5)

## 2017-05-27 LAB — SURGICAL PCR SCREEN
MRSA, PCR: NEGATIVE
Staphylococcus aureus: NEGATIVE

## 2017-05-27 LAB — HIV ANTIBODY (ROUTINE TESTING W REFLEX): HIV Screen 4th Generation wRfx: NONREACTIVE

## 2017-05-27 SURGERY — OPEN REDUCTION INTERNAL FIXATION (ORIF) ANKLE FRACTURE
Anesthesia: General | Laterality: Right

## 2017-05-27 MED ORDER — NICOTINE 14 MG/24HR TD PT24
14.0000 mg | MEDICATED_PATCH | Freq: Every day | TRANSDERMAL | Status: DC
  Administered 2017-05-27 – 2017-05-30 (×4): 14 mg via TRANSDERMAL
  Filled 2017-05-27 (×3): qty 1

## 2017-05-27 MED ORDER — CEFAZOLIN SODIUM-DEXTROSE 1-4 GM/50ML-% IV SOLN
1.0000 g | Freq: Four times a day (QID) | INTRAVENOUS | Status: AC
  Administered 2017-05-27 – 2017-05-28 (×3): 1 g via INTRAVENOUS
  Filled 2017-05-27 (×3): qty 50

## 2017-05-27 MED ORDER — CHLORHEXIDINE GLUCONATE 4 % EX LIQD
60.0000 mL | Freq: Once | CUTANEOUS | Status: AC
  Administered 2017-05-27: 4 via TOPICAL

## 2017-05-27 MED ORDER — NEOSTIGMINE METHYLSULFATE 10 MG/10ML IV SOLN
INTRAVENOUS | Status: DC | PRN
  Administered 2017-05-27: 3 mg via INTRAVENOUS

## 2017-05-27 MED ORDER — FENTANYL CITRATE (PF) 100 MCG/2ML IJ SOLN
INTRAMUSCULAR | Status: DC | PRN
  Administered 2017-05-27 (×4): 50 ug via INTRAVENOUS
  Administered 2017-05-27: 150 ug via INTRAVENOUS

## 2017-05-27 MED ORDER — PROPOFOL 10 MG/ML IV BOLUS
INTRAVENOUS | Status: DC | PRN
  Administered 2017-05-27: 120 mg via INTRAVENOUS

## 2017-05-27 MED ORDER — DEXAMETHASONE SODIUM PHOSPHATE 10 MG/ML IJ SOLN
INTRAMUSCULAR | Status: AC
  Filled 2017-05-27: qty 1

## 2017-05-27 MED ORDER — LACTATED RINGERS IV SOLN
INTRAVENOUS | Status: DC | PRN
  Administered 2017-05-27: 08:00:00 via INTRAVENOUS

## 2017-05-27 MED ORDER — MIDAZOLAM HCL 2 MG/2ML IJ SOLN
INTRAMUSCULAR | Status: AC
  Filled 2017-05-27: qty 2

## 2017-05-27 MED ORDER — METHOCARBAMOL 500 MG PO TABS
ORAL_TABLET | ORAL | Status: AC
  Administered 2017-05-27: 13:00:00
  Filled 2017-05-27: qty 1

## 2017-05-27 MED ORDER — MEPERIDINE HCL 50 MG/ML IJ SOLN
6.2500 mg | INTRAMUSCULAR | Status: DC | PRN
  Administered 2017-05-27 (×2): 12.5 mg via INTRAVENOUS

## 2017-05-27 MED ORDER — CEFAZOLIN SODIUM-DEXTROSE 2-3 GM-%(50ML) IV SOLR
INTRAVENOUS | Status: DC | PRN
  Administered 2017-05-27: 2 g via INTRAVENOUS

## 2017-05-27 MED ORDER — 0.9 % SODIUM CHLORIDE (POUR BTL) OPTIME
TOPICAL | Status: DC | PRN
  Administered 2017-05-27: 500 mL

## 2017-05-27 MED ORDER — HYDROMORPHONE HCL 2 MG/ML IJ SOLN
0.3000 mg | INTRAMUSCULAR | Status: DC | PRN
  Administered 2017-05-27 (×4): 0.5 mg via INTRAVENOUS

## 2017-05-27 MED ORDER — ONDANSETRON 4 MG PO TBDP: 4.0000 mg | ORAL_TABLET | Freq: Three times a day (TID) | ORAL | 0 refills | Status: AC | PRN

## 2017-05-27 MED ORDER — MEPERIDINE HCL 50 MG/ML IJ SOLN
INTRAMUSCULAR | Status: AC
  Administered 2017-05-27: 12.5 mg via INTRAVENOUS
  Filled 2017-05-27: qty 1

## 2017-05-27 MED ORDER — ONDANSETRON HCL 4 MG/2ML IJ SOLN: 4.0000 mg | Freq: Once | INTRAMUSCULAR | Status: DC | PRN

## 2017-05-27 MED ORDER — PROPOFOL 10 MG/ML IV BOLUS
INTRAVENOUS | Status: AC
  Filled 2017-05-27: qty 20

## 2017-05-27 MED ORDER — POVIDONE-IODINE 10 % EX SWAB: 2.0000 "application " | Freq: Once | CUTANEOUS | Status: DC

## 2017-05-27 MED ORDER — HYDROCODONE-ACETAMINOPHEN 5-325 MG PO TABS: 1.0000 | ORAL_TABLET | ORAL | 0 refills | Status: AC | PRN

## 2017-05-27 MED ORDER — ENOXAPARIN SODIUM 40 MG/0.4ML ~~LOC~~ SOLN
40.0000 mg | SUBCUTANEOUS | Status: DC
  Administered 2017-05-28 – 2017-06-04 (×8): 40 mg via SUBCUTANEOUS
  Filled 2017-05-27 (×9): qty 0.4

## 2017-05-27 MED ORDER — METOCLOPRAMIDE HCL 5 MG/ML IJ SOLN: 5.0000 mg | Freq: Three times a day (TID) | INTRAMUSCULAR | Status: DC | PRN

## 2017-05-27 MED ORDER — DOCUSATE SODIUM 100 MG PO CAPS
100.0000 mg | ORAL_CAPSULE | Freq: Two times a day (BID) | ORAL | Status: DC
  Administered 2017-05-27 – 2017-06-04 (×18): 100 mg via ORAL
  Filled 2017-05-27 (×17): qty 1

## 2017-05-27 MED ORDER — LIDOCAINE HCL (CARDIAC) PF 100 MG/5ML IV SOSY
PREFILLED_SYRINGE | INTRAVENOUS | Status: DC | PRN
  Administered 2017-05-27: 100 mg via INTRAVENOUS

## 2017-05-27 MED ORDER — ROCURONIUM BROMIDE 100 MG/10ML IV SOLN
INTRAVENOUS | Status: DC | PRN
  Administered 2017-05-27: 50 mg via INTRAVENOUS

## 2017-05-27 MED ORDER — ONDANSETRON HCL 4 MG/2ML IJ SOLN
4.0000 mg | Freq: Four times a day (QID) | INTRAMUSCULAR | Status: DC | PRN
  Administered 2017-06-01: 4 mg via INTRAVENOUS
  Filled 2017-05-27: qty 2

## 2017-05-27 MED ORDER — HYDROMORPHONE HCL 2 MG/ML IJ SOLN
INTRAMUSCULAR | Status: AC
  Administered 2017-05-27: 0.5 mg via INTRAVENOUS
  Filled 2017-05-27: qty 1

## 2017-05-27 MED ORDER — METOCLOPRAMIDE HCL 5 MG PO TABS: 5.0000 mg | ORAL_TABLET | Freq: Three times a day (TID) | ORAL | Status: DC | PRN

## 2017-05-27 MED ORDER — FENTANYL CITRATE (PF) 250 MCG/5ML IJ SOLN
INTRAMUSCULAR | Status: AC
  Filled 2017-05-27: qty 5

## 2017-05-27 MED ORDER — LACTATED RINGERS IV SOLN
INTRAVENOUS | Status: DC
  Administered 2017-05-27 – 2017-05-31 (×3): via INTRAVENOUS

## 2017-05-27 MED ORDER — SERTRALINE HCL 100 MG PO TABS
100.0000 mg | ORAL_TABLET | Freq: Every day | ORAL | Status: DC
  Administered 2017-05-27 – 2017-06-05 (×10): 100 mg via ORAL
  Filled 2017-05-27 (×10): qty 1

## 2017-05-27 MED ORDER — GLYCOPYRROLATE 0.2 MG/ML IJ SOLN
INTRAMUSCULAR | Status: DC | PRN
  Administered 2017-05-27: 0.6 mg via INTRAVENOUS

## 2017-05-27 MED ORDER — DEXAMETHASONE SODIUM PHOSPHATE 4 MG/ML IJ SOLN
INTRAMUSCULAR | Status: DC | PRN
  Administered 2017-05-27: 10 mg via INTRAVENOUS

## 2017-05-27 MED ORDER — MIRTAZAPINE 15 MG PO TABS
30.0000 mg | ORAL_TABLET | Freq: Every day | ORAL | Status: DC
  Administered 2017-05-27 – 2017-06-04 (×9): 30 mg via ORAL
  Filled 2017-05-27 (×9): qty 2

## 2017-05-27 MED ORDER — MIDAZOLAM HCL 5 MG/5ML IJ SOLN
INTRAMUSCULAR | Status: DC | PRN
  Administered 2017-05-27: 2 mg via INTRAVENOUS

## 2017-05-27 MED ORDER — ONDANSETRON HCL 4 MG PO TABS
4.0000 mg | ORAL_TABLET | Freq: Four times a day (QID) | ORAL | Status: DC | PRN
  Administered 2017-05-29 – 2017-06-02 (×3): 4 mg via ORAL
  Filled 2017-05-27 (×3): qty 1

## 2017-05-27 MED ORDER — CEFAZOLIN SODIUM-DEXTROSE 2-4 GM/100ML-% IV SOLN
2.0000 g | INTRAVENOUS | Status: AC
  Filled 2017-05-27: qty 100

## 2017-05-27 SURGICAL SUPPLY — 118 items
ALCOHOL 70% 16 OZ (MISCELLANEOUS) ×3 IMPLANT
BANDAGE ACE 3X5.8 VEL STRL LF (GAUZE/BANDAGES/DRESSINGS) ×3 IMPLANT
BANDAGE ACE 4X5 VEL STRL LF (GAUZE/BANDAGES/DRESSINGS) ×12 IMPLANT
BANDAGE ACE 6X5 VEL STRL LF (GAUZE/BANDAGES/DRESSINGS) IMPLANT
BANDAGE ESMARK 6X9 LF (GAUZE/BANDAGES/DRESSINGS) IMPLANT
BIT DRILL 2.2 SS TIBIAL (BIT) ×3 IMPLANT
BIT DRILL 2.5X2.75 QC CALB (BIT) ×3 IMPLANT
BIT DRILL 2.9X70 QC CALB (BIT) ×3 IMPLANT
BIT DRILL 3.5X5.5 QC CALB (BIT) ×3 IMPLANT
BLADE CLIPPER SURG (BLADE) IMPLANT
BLADE SURG 15 STRL LF DISP TIS (BLADE) ×1 IMPLANT
BLADE SURG 15 STRL SS (BLADE) ×2
BNDG COHESIVE 4X5 TAN STRL (GAUZE/BANDAGES/DRESSINGS) ×3 IMPLANT
BNDG ESMARK 4X9 LF (GAUZE/BANDAGES/DRESSINGS) ×3 IMPLANT
BNDG ESMARK 6X9 LF (GAUZE/BANDAGES/DRESSINGS)
BNDG GAUZE ELAST 4 BULKY (GAUZE/BANDAGES/DRESSINGS) ×3 IMPLANT
CANISTER SUCT 3000ML PPV (MISCELLANEOUS) ×3 IMPLANT
CORDS BIPOLAR (ELECTRODE) ×3 IMPLANT
COVER BACK TABLE 60X90IN (DRAPES) ×3 IMPLANT
COVER SURGICAL LIGHT HANDLE (MISCELLANEOUS) ×6 IMPLANT
CUFF TOURNIQUET SINGLE 18IN (TOURNIQUET CUFF) ×3 IMPLANT
CUFF TOURNIQUET SINGLE 34IN LL (TOURNIQUET CUFF) ×3 IMPLANT
DRAIN TLS ROUND 10FR (DRAIN) IMPLANT
DRAPE C-ARM 42X72 X-RAY (DRAPES) ×3 IMPLANT
DRAPE C-ARMOR (DRAPES) ×3 IMPLANT
DRAPE EXTREMITY T 121X128X90 (DRAPE) ×3 IMPLANT
DRAPE OEC MINIVIEW 54X84 (DRAPES) ×6 IMPLANT
DRAPE SURG 17X11 SM STRL (DRAPES) ×3 IMPLANT
DRAPE U-SHAPE 47X51 STRL (DRAPES) ×3 IMPLANT
DRSG ADAPTIC 3X8 NADH LF (GAUZE/BANDAGES/DRESSINGS) ×3 IMPLANT
DRSG EMULSION OIL 3X3 NADH (GAUZE/BANDAGES/DRESSINGS) ×3 IMPLANT
DRSG PAD ABDOMINAL 8X10 ST (GAUZE/BANDAGES/DRESSINGS) ×3 IMPLANT
DURAPREP 26ML APPLICATOR (WOUND CARE) ×3 IMPLANT
ELECT REM PT RETURN 9FT ADLT (ELECTROSURGICAL) ×3
ELECTRODE REM PT RTRN 9FT ADLT (ELECTROSURGICAL) ×1 IMPLANT
GAUZE SPONGE 4X4 12PLY STRL (GAUZE/BANDAGES/DRESSINGS) ×6 IMPLANT
GLOVE BIO SURGEON STRL SZ7.5 (GLOVE) ×3 IMPLANT
GLOVE BIOGEL PI IND STRL 8 (GLOVE) ×2 IMPLANT
GLOVE BIOGEL PI IND STRL 8.5 (GLOVE) ×1 IMPLANT
GLOVE BIOGEL PI INDICATOR 8 (GLOVE) ×4
GLOVE BIOGEL PI INDICATOR 8.5 (GLOVE) ×2
GLOVE SURG ORTHO 8.0 STRL STRW (GLOVE) ×3 IMPLANT
GOWN STRL REUS W/ TWL LRG LVL3 (GOWN DISPOSABLE) ×5 IMPLANT
GOWN STRL REUS W/ TWL XL LVL3 (GOWN DISPOSABLE) ×3 IMPLANT
GOWN STRL REUS W/TWL LRG LVL3 (GOWN DISPOSABLE) ×10
GOWN STRL REUS W/TWL XL LVL3 (GOWN DISPOSABLE) ×6
K-WIRE 1.6 (WIRE) ×2
K-WIRE ACE 1.6X6 (WIRE) ×6
K-WIRE FX5X1.6XNS BN SS (WIRE) ×1
KIT BASIN OR (CUSTOM PROCEDURE TRAY) ×6 IMPLANT
KIT TURNOVER KIT B (KITS) ×6 IMPLANT
KWIRE ACE 1.6X6 (WIRE) ×2 IMPLANT
KWIRE FX5X1.6XNS BN SS (WIRE) ×1 IMPLANT
MANIFOLD NEPTUNE II (INSTRUMENTS) ×3 IMPLANT
NEEDLE HYPO 25GX1X1/2 BEV (NEEDLE) IMPLANT
NEEDLE HYPO 25X1 1.5 SAFETY (NEEDLE) ×3 IMPLANT
NS IRRIG 1000ML POUR BTL (IV SOLUTION) ×6 IMPLANT
PACK ORTHO EXTREMITY (CUSTOM PROCEDURE TRAY) ×6 IMPLANT
PAD ABD 8X10 STRL (GAUZE/BANDAGES/DRESSINGS) ×6 IMPLANT
PAD ARMBOARD 7.5X6 YLW CONV (MISCELLANEOUS) ×12 IMPLANT
PAD CAST 3X4 CTTN HI CHSV (CAST SUPPLIES) ×1 IMPLANT
PAD CAST 4YDX4 CTTN HI CHSV (CAST SUPPLIES) ×3 IMPLANT
PADDING CAST COTTON 3X4 STRL (CAST SUPPLIES) ×2
PADDING CAST COTTON 4X4 STRL (CAST SUPPLIES) ×6
PADDING CAST COTTON 6X4 STRL (CAST SUPPLIES) ×3 IMPLANT
PEG LOCKING SMOOTH 2.2X16 (Screw) ×3 IMPLANT
PEG LOCKING SMOOTH 2.2X18 (Peg) ×9 IMPLANT
PEG LOCKING SMOOTH 2.2X20 (Screw) ×3 IMPLANT
PLATE ACE 100DEG 7HOLE (Plate) ×3 IMPLANT
PLATE DVR CROSSLOCK STD RT (Plate) ×3 IMPLANT
SCREW ACE CAN 4.0 40M (Screw) ×6 IMPLANT
SCREW CORTICAL 3.5MM  10MM (Screw) ×2 IMPLANT
SCREW CORTICAL 3.5MM  12MM (Screw) ×10 IMPLANT
SCREW CORTICAL 3.5MM  20MM (Screw) ×2 IMPLANT
SCREW CORTICAL 3.5MM 10MM (Screw) ×1 IMPLANT
SCREW CORTICAL 3.5MM 12MM (Screw) ×5 IMPLANT
SCREW CORTICAL 3.5MM 14MM (Screw) ×6 IMPLANT
SCREW CORTICAL 3.5MM 18MM (Screw) ×3 IMPLANT
SCREW CORTICAL 3.5MM 20MM (Screw) ×1 IMPLANT
SCREW CORTICAL 3.5MM 24MM (Screw) ×3 IMPLANT
SCREW LOCK 12X2.7X 3 LD (Screw) ×2 IMPLANT
SCREW LOCK 14X2.7X 3 LD TPR (Screw) ×3 IMPLANT
SCREW LOCK 16X2.7X 3 LD TPR (Screw) ×1 IMPLANT
SCREW LOCK 20X2.7X 3 LD TPR (Screw) ×1 IMPLANT
SCREW LOCKING 2.7X12MM (Screw) ×4 IMPLANT
SCREW LOCKING 2.7X14 (Screw) ×6 IMPLANT
SCREW LOCKING 2.7X16 (Screw) ×2 IMPLANT
SCREW LOCKING 2.7X20MM (Screw) ×2 IMPLANT
SCREW LP NL 2.7X22MM (Screw) ×3 IMPLANT
SCREW MULTI DIRECTIONAL 2.7X18 (Screw) ×3 IMPLANT
SCREW NLOCK CANC HEX 4X14 (Screw) ×6 IMPLANT
SCREW NLOCK CANC HEX 4X16 (Screw) ×9 IMPLANT
SOAP 2 % CHG 4 OZ (WOUND CARE) ×3 IMPLANT
SPLINT FIBERGLASS 3X35 (CAST SUPPLIES) ×3 IMPLANT
SPLINT FIBERGLASS 4X30 (CAST SUPPLIES) ×3 IMPLANT
SPONGE LAP 18X18 X RAY DECT (DISPOSABLE) IMPLANT
STAPLER VISISTAT 35W (STAPLE) ×3 IMPLANT
SUCTION FRAZIER HANDLE 10FR (MISCELLANEOUS) ×4
SUCTION TUBE FRAZIER 10FR DISP (MISCELLANEOUS) ×2 IMPLANT
SUT ETHILON 3 0 PS 1 (SUTURE) ×3 IMPLANT
SUT MAXBRAID (SUTURE) ×3 IMPLANT
SUT PROLENE 4 0 PS 2 18 (SUTURE) ×3 IMPLANT
SUT VIC AB 0 CT1 27 (SUTURE)
SUT VIC AB 0 CT1 27XBRD ANBCTR (SUTURE) IMPLANT
SUT VIC AB 2-0 CT1 27 (SUTURE) ×4
SUT VIC AB 2-0 CT1 TAPERPNT 27 (SUTURE) ×2 IMPLANT
SUT VIC AB 2-0 FS1 27 (SUTURE) IMPLANT
SUT VIC AB 4-0 PS2 27 (SUTURE) ×3 IMPLANT
SUT VICRYL 4-0 PS2 18IN ABS (SUTURE) IMPLANT
SYR BULB 3OZ (MISCELLANEOUS) ×3 IMPLANT
SYR CONTROL 10ML LL (SYRINGE) IMPLANT
SYSTEM CHEST DRAIN TLS 7FR (DRAIN) IMPLANT
TOWEL OR 17X24 6PK STRL BLUE (TOWEL DISPOSABLE) ×3 IMPLANT
TOWEL OR 17X26 10 PK STRL BLUE (TOWEL DISPOSABLE) ×9 IMPLANT
TUBE CONNECTING 12'X1/4 (SUCTIONS) ×3
TUBE CONNECTING 12X1/4 (SUCTIONS) ×6 IMPLANT
WATER STERILE IRR 1000ML POUR (IV SOLUTION) ×3 IMPLANT
YANKAUER SUCT BULB TIP NO VENT (SUCTIONS) ×6 IMPLANT

## 2017-05-27 NOTE — Evaluation (Signed)
Occupational Therapy Evaluation Patient Details Name: Lindsey Ramos MRN: [id number] DOB: 12-06-1995 Today's Date: 05/27/2017    History of Present Illness 22 yo female presented to ED with suicidal ideation and complaints about her living environment. Hx of bipolar d/o and fall on or around 05/14/17. Imaging (+) R trimalleolar ankle fx, R comminuted distal radius fx, and R minimally displaced ulnar fx. S/p ORIF of right of displaced intra-articular distal radius fx and ORIF of right ankle fx on 05/27/17.    Clinical Impression   PTA, pt was living at a group home and was independent prior to her fall on 5/13. Since recent injury, pt has been using crutches and requires assistance for bathing. Pt's guardian reporting she will have good support at dc. Pt currently requiring Min A for self feeding and grooming, Min-Mod A for UB ADLs, and Min A for LB ADLs. Pt presenting with decrease processing and problem solving and benefits from simple, direct cues and calm environment. Pt will require further acute OT to facilitate safe dc and increase independence with ADLs. Recommend dc to CIR for intensive OT to optimize safety, reach Mod I level with ADLs and mobility, and decrease caregiver burden.    Follow Up Recommendations  CIR;Supervision/Assistance - 24 hour    Equipment Recommendations  Other (comment)(Defer to next venue)    Recommendations for Other Services Rehab consult;PT consult     Precautions / Restrictions Precautions Precautions: Fall Required Braces or Orthoses: Other Brace/Splint Other Brace/Splint: splint/cast R arm and R lower leg Restrictions Weight Bearing Restrictions: Yes RUE Weight Bearing: Weight bear through elbow only RLE Weight Bearing: Non weight bearing      Mobility Bed Mobility Overal bed mobility: Modified Independent             General bed mobility comments: Increased time and HOB elevated  Transfers Overall transfer level: Needs  assistance Equipment used: 1 person hand held assist Transfers: Sit to/from [employer] Sit to Stand: Min assist   Squat pivot transfers: Min assist     General transfer comment: Min A for balance and adherance to NWB status    Balance Overall balance assessment: History of Falls;Needs assistance Sitting-balance support: No upper extremity supported;Feet supported Sitting balance-Leahy Scale: Good     Standing balance support: Bilateral upper extremity supported Standing balance-Leahy Scale: Poor Standing balance comment: Reliant on UE support                           ADL either performed or assessed with clinical judgement   ADL Overall ADL's : Needs assistance/impaired Eating/Feeding: Sitting;Minimal assistance Eating/Feeding Details (indicate cue type and reason): Pt requiring Min A to open containers and perform bilateral tasks Grooming: Minimal assistance;Sitting   Upper Body Bathing: Moderate assistance;Sitting   Lower Body Bathing: Minimal assistance;Sit to/from stand   Upper Body Dressing : Minimal assistance;Sitting   Lower Body Dressing: Sit to/from stand;Minimal assistance Lower Body Dressing Details (indicate cue type and reason): Pt donning underwear with Min A for standing balance. Pt requiring Moderate cues for adherance to NWB status and to don underwear correctly. Pt putting underwear on upside down first. Toilet Transfer: Minimal assistance;Stand-pivot(simulated to recliner) Toilet Transfer Details (indicate cue type and reason): Min A for stability and adhere to NWB status         Functional mobility during ADLs: Minimal assistance;Cueing for safety(stand pivot only) General ADL Comments: Pt presenting with decreased functional performance and requiring assistance for  adherance to NWB status (wanting to perform TDWB instead).      Vision         Perception     Praxis      Pertinent Vitals/Pain Pain Assessment:  Faces Pain Score: 10-Worst pain ever Faces Pain Scale: Hurts little more Pain Location: R arm, R ankle Pain Descriptors / Indicators: Aching Pain Intervention(s): Monitored during session;Repositioned     Hand Dominance Left   Extremity/Trunk Assessment Upper Extremity Assessment Upper Extremity Assessment: RUE deficits/detail RUE Deficits / Details: in splint/cast. Pt able to wiggle fingers. WFL shoulder ROM RUE: Unable to fully assess due to immobilization RUE Coordination: decreased fine motor;decreased gross motor   Lower Extremity Assessment Lower Extremity Assessment: RLE deficits/detail RLE Deficits / Details: in splint/cast. Pt able to wiggly toes RLE: Unable to fully assess due to immobilization   Cervical / Trunk Assessment Cervical / Trunk Assessment: Normal   Communication Communication Communication: No difficulties   Cognition Arousal/Alertness: Awake/alert Behavior During Therapy: Flat affect Overall Cognitive Status: Impaired/Different from baseline Area of Impairment: Attention;Following commands;Problem solving;Awareness                   Current Attention Level: Selective   Following Commands: Follows one step commands with increased time   Awareness: Emergent Problem Solving: Slow processing;Requires verbal cues General Comments: Feel this is close to pt's baseline cognition. Pt requiring increased time for processing. Benefits from possitive words, simple commands, and encouragement.   General Comments  Gaurdian leaving right before session and discussion dc situation with OT. Sitter present throughout session    Exercises     Shoulder Instructions      Home Living Family/patient expects to be discharged to:: Unsure                                 Additional Comments: pt was living at group home, but gaurdian has decided home was unsafe for pt. Unsure dc plan, but gaurdian states pt will have good support at dc.       Prior Functioning/Environment Level of Independence: Needs assistance  Gait / Transfers Assistance Needed: pt has been using crutches to get around ADL's / Homemaking Assistance Needed: assist with bathing   Comments: Prior to injury on May 13th, pt was independent with ADLs        OT Problem List: Decreased range of motion;Impaired balance (sitting and/or standing);Decreased safety awareness;Decreased knowledge of precautions;Decreased knowledge of use of DME or AE;Pain      OT Treatment/Interventions: Self-care/ADL training;Therapeutic exercise;Energy conservation;DME and/or AE instruction;Therapeutic activities;Patient/family education    OT Goals(Current goals can be found in the care plan section) Acute Rehab OT Goals Patient Stated Goal: walk again OT Goal Formulation: With patient Time For Goal Achievement: 06/10/17 Potential to Achieve Goals: Good ADL Goals Pt Will Perform Upper Body Dressing: with modified independence;sitting Pt Will Perform Lower Body Dressing: with modified independence;sit to/from stand Pt Will Transfer to Toilet: with modified independence;ambulating;bedside commode Pt Will Perform Toileting - Clothing Manipulation and hygiene: with modified independence;sit to/from stand Additional ADL Goal #1: Pt will independently verablize two edema management techniques  OT Frequency: Min 3X/week   Barriers to D/C:            Co-evaluation              AM-PAC PT "6 Clicks" Daily Activity     Outcome Measure Help from another person eating meals?:  A Little Help from another person taking care of personal grooming?: A Little Help from another person toileting, which includes using toliet, bedpan, or urinal?: A Little Help from another person bathing (including washing, rinsing, drying)?: A Little Help from another person to put on and taking off regular upper body clothing?: A Lot Help from another person to put on and taking off regular lower body  clothing?: A Little 6 Click Score: 17   End of Session Equipment Utilized During Treatment: Gait belt Nurse Communication: Mobility status;Weight bearing status  Activity Tolerance: Patient tolerated treatment well Patient left: in chair;with call bell/phone within reach;with nursing/sitter in room  OT Visit Diagnosis: Unsteadiness on feet (R26.81);Other abnormalities of gait and mobility (R26.89);Muscle weakness (generalized) (M62.81);Pain Pain - Right/Left: Right Pain - part of body: Arm;Hand;Leg;Ankle and joints of foot                Time: 1627-1700 OT Time Calculation (min): 33 min Charges:  OT General Charges $OT Visit: 1 Visit OT Evaluation $OT Eval Moderate Complexity: 1 Mod OT Treatments $Self Care/Home Management : 8-22 mins G-Codes:     Iva Posten MSOT, OTR/L Acute Rehab Pager: [phone] Office: [phone]  Theodoro Grist Vinessa Macconnell 05/27/2017, 5:34 PM

## 2017-05-27 NOTE — Progress Notes (Signed)
Patient transferred from [facility] via care link. Patient accompanied by a  sitter . Patient is able to safely transfer  from stretcher to the bed.

## 2017-05-27 NOTE — Consult Note (Signed)
The patient was seen and examined today. Patient does have the displaced intra-articular distal radius fracture. Spoke with the patient about open reduction and internal fixation and stabilization of the incongruence joint Risks of surgery include but not limited to bleeding infection damage to nearby nerves arteries or tendons nonunion malunion hardware failure loss of motion of the wrists and digits incomplete relief of symptoms and need for further surgical intervention. R/B/A DISCUSSED WITH PT IN HOSPITAL.  PT VOICED UNDERSTANDING OF PLAN CONSENT SIGNED DAY OF SURGERY PT SEEN AND EXAMINED PRIOR TO OPERATIVE PROCEDURE/DAY OF SURGERY SITE MARKED. QUESTIONS ANSWERED WILL REMAIN AN INPATIENT FOLLOWING SURGERY

## 2017-05-27 NOTE — Progress Notes (Signed)
See MAR - demerol for shivers

## 2017-05-27 NOTE — Anesthesia Postprocedure Evaluation (Signed)
Anesthesia Post Note  Patient: Senaida Ores  Procedure(s) Performed: OPEN REDUCTION INTERNAL FIXATION (ORIF) ANKLE FRACTURE (Right ) OPEN REDUCTION INTERNAL FIXATION (ORIF) WRIST FRACTURE (Right )     Patient location during evaluation: PACU Anesthesia Type: General Level of consciousness: awake and alert Pain management: pain level controlled Vital Signs Assessment: post-procedure vital signs reviewed and stable Respiratory status: spontaneous breathing, nonlabored ventilation, respiratory function stable and patient connected to nasal cannula oxygen Cardiovascular status: blood pressure returned to baseline and stable Postop Assessment: no apparent nausea or vomiting Anesthetic complications: no    Last Vitals:  Vitals:   05/27/17 1138 05/27/17 1153  BP: (!) 148/73 (!) 148/67  Pulse: (!) 103 81  Resp: 15 19  Temp:  (!) 36.4 C  SpO2: 96% 98%    Last Pain:  Vitals:   05/27/17 1145  TempSrc:   PainSc: 6                  Jeany Seville DAVID

## 2017-05-27 NOTE — Anesthesia Procedure Notes (Signed)
Procedure Name: Intubation Date/Time: 05/27/2017 7:54 AM Performed by: Tillman Abide, CRNA Pre-anesthesia Checklist: Patient identified, Emergency Drugs available, Suction available and Patient being monitored Patient Re-evaluated:Patient Re-evaluated prior to induction Oxygen Delivery Method: Circle System Utilized Preoxygenation: Pre-oxygenation with 100% oxygen Induction Type: IV induction Ventilation: Mask ventilation without difficulty Laryngoscope Size: Miller and 3 Grade View: Grade I Tube type: Oral Tube size: 7.0 mm Number of attempts: 1 Airway Equipment and Method: Stylet Placement Confirmation: ETT inserted through vocal cords under direct vision,  positive ETCO2 and breath sounds checked- equal and bilateral Secured at: 22 cm Tube secured with: Tape Dental Injury: Teeth and Oropharynx as per pre-operative assessment

## 2017-05-27 NOTE — Anesthesia Preprocedure Evaluation (Signed)
Anesthesia Evaluation  Patient identified by MRN, date of birth, ID band Patient awake    Reviewed: Allergy & Precautions, NPO status , Patient's Chart, lab work & pertinent test results  Airway Mallampati: I  TM Distance: >3 FB Neck ROM: Full    Dental   Pulmonary Current Smoker,    Pulmonary exam normal        Cardiovascular Normal cardiovascular exam     Neuro/Psych Bipolar Disorder    GI/Hepatic   Endo/Other    Renal/GU      Musculoskeletal   Abdominal   Peds  Hematology   Anesthesia Other Findings   Reproductive/Obstetrics                             Anesthesia Physical Anesthesia Plan  ASA: III  Anesthesia Plan: General   Post-op Pain Management:    Induction: Intravenous  PONV Risk Score and Plan: 2 and Ondansetron, Midazolam and Treatment may vary due to age or medical condition  Airway Management Planned: Oral ETT  Additional Equipment:   Intra-op Plan:   Post-operative Plan: Extubation in OR  Informed Consent: I have reviewed the patients History and Physical, chart, labs and discussed the procedure including the risks, benefits and alternatives for the proposed anesthesia with the patient or authorized representative who has indicated his/her understanding and acceptance.     Plan Discussed with: CRNA and Surgeon  Anesthesia Plan Comments:         Anesthesia Quick Evaluation

## 2017-05-27 NOTE — Op Note (Addendum)
PREOPERATIVE DIAGNOSIS:Right wrist intra-articular distal radius fracture 3 more fragments  POSTOPERATIVE DIAGNOSIS:same  ATTENDING SURGEON:Dr. Gilman Schmidt who was scrubbed and present for the entire procedure  ASSISTANT SURGEON:none  ANESTHESIA:supraclavicular block with general anesthesia via LMA  OPERATIVE PROCEDURE: #1: Open reduction and internal fixation of displaced intra-articular distal radius fracture 3 more fragments #2: Right wrist brachia radialis tendon release, tendon tenotomy #3: Radiographs 3 views Right wrist  IMPLANTS: DVR cross lock standard  RADIOGRAPHIC INTERPRETATION:AP lateral oblique views the wrist to show the volar plate fixation place in good position good alignment and restoration of radial height inclination and tilt  SURGICAL INDICATIONS:patient is a right-hand-dominant female who sustained a closed injury to her right wrist. Patient was seen and evaluated in the office and recommended undergo the above procedure. Risks benefits and alternatives were discussed in detail with the patient in a signed informed consent was obtained. Risks include but not limited to bleeding infection damage to nearby nerves arteries or tendons nonunion malunion hardware failure loss of motion of the wrists and digits incomplete relief of symptoms and need for further surgical intervention  SURGICAL TECHNIQUE:Patient is properly identified in the preoperative holding area marked for permanent marker made on the right wrist indicate the correct operative site. Patient brought back to the operating placed supine on anesthesia and table where the general anesthetic was administered. Preoperative antibiotics were given prior any skin incision. A well-padded tourniquet was then placed on the right brachium and sealed with the appropriate drape. Right upper extremities and prepped and draped in normal sterile fashion. A timeout was called the correct site was identified and the  procedure then begun. Attention then turned to the right wrist. A longitudinal incision made directly over the dorsal aspect of the wrist after the limited bit elevated and the tourniquet insufflated. Dissection carried down through the skin and subcutaneous tissue. The FCR sheath was then opened up. Going through the floor the FCR sheath the FPL was identified and swept out of the way. The pronator quadratus was then elevated in an L-shaped fashion. In order to expose the comminuted radial column the brachial radialis then carefully released off the radial styloid. Tendon tenotomy was then done of the brachia radialis to gain exposure to the intra-articular fracture. The fracture site was then exposed an open reduction was then performed. This was an intra-articular fracture 3 more fragments. Open reduction was then performed. The volar plate was then applied. It was held distally with a K wire. Plate position was then confirmed in the oblong screw hole was placed proximally. Distal fixation was then carried out from an ulnar to radial direction. This was a standard cross lock plate. Combination of distal locking pegs and screws and one multidirectional screw were then used. Shaft fixation was then carried out. The wound was then thoroughly irrigated. Final radiographs were then obtained. Stress examination of the wrists revealed no instability the distal radioulnar joint after stability the distal radius or widening of the SL interval. Final radiographs were then obtained. The pronator quadratus was then closed with 2-0 Vicryl suture. Subcutaneous tissues closed with 4-0 Vicryl and the skin closed with simple Prolene suture. Adaptic dressing and a sterile compressive bandage then applied. The patient is then placed in well-padded sugar tong splint and extubated taken recovery room in good condition  POSTOPERATIVE PLAN: Patient to be admitted for pain control and IV abx. No WB right wrist, ok to use platform  walker. Seen back in the office in 2 weeks  for wound check suture removal application of a short arm cast place the therapy order begin the four-week mark. Radiographs right wrist at each visit.

## 2017-05-27 NOTE — Discharge Instructions (Signed)
Orthopedic discharge instructions:  -Maintain your splints to the right arm and right ankle at all times.  Keep these clean and dry. -Do not weight-bear to your right lower extremity.  You are okay for partial weightbearing with your right arm.  Elevate both extremities at all times when able. -For mild to moderate pain use Tylenol and/or Advil as needed.  For breakthrough pain use Norco as necessary. -Return to see Dr. Melvyn Novas and Dr. Aundria Rud in 2 weeks.

## 2017-05-27 NOTE — Transfer of Care (Signed)
Immediate Anesthesia Transfer of Care Note  Patient: Lindsey Ramos  Procedure(s) Performed: OPEN REDUCTION INTERNAL FIXATION (ORIF) ANKLE FRACTURE (Right ) OPEN REDUCTION INTERNAL FIXATION (ORIF) WRIST FRACTURE (Right )  Patient Location: PACU  Anesthesia Type:General  Level of Consciousness: awake, alert , oriented and patient cooperative  Airway & Oxygen Therapy: Patient Spontanous Breathing  Post-op Assessment: Report given to RN and Post -op Vital signs reviewed and stable  Post vital signs: Reviewed and stable  Last Vitals:  Vitals Value Taken Time  BP 128/77 05/27/2017 10:38 AM  Temp    Pulse 102 05/27/2017 10:42 AM  Resp 18 05/27/2017 10:42 AM  SpO2 99 % 05/27/2017 10:42 AM  Vitals shown include unvalidated device data.  Last Pain:  Vitals:   05/27/17 0500  TempSrc: Oral  PainSc:          Complications: No apparent anesthesia complications

## 2017-05-27 NOTE — Brief Op Note (Signed)
05/27/2017  10:38 AM  PATIENT:  Lindsey Ramos  22 y.o. female  PRE-OPERATIVE DIAGNOSIS:  right distal radius, right ankle fracture  POST-OPERATIVE DIAGNOSIS:  right distal radius, right ankle fracture  PROCEDURE:  Procedure(s): OPEN REDUCTION INTERNAL FIXATION (ORIF) ANKLE FRACTURE (Right) OPEN REDUCTION INTERNAL FIXATION (ORIF) WRIST FRACTURE (Right)  SURGEON:  Surgeon(s) and Role: Panel 1:    * Yolonda Kida, MD - Primary Panel 2:    Bradly Bienenstock, MD - Primary  PHYSICIAN ASSISTANT:   ASSISTANTS: none   ANESTHESIA:   general  EBL:  30 cc  BLOOD ADMINISTERED:none  DRAINS: none   LOCAL MEDICATIONS USED:  NONE  SPECIMEN:  No Specimen  DISPOSITION OF SPECIMEN:  N/A  COUNTS:  YES  TOURNIQUET:   Total Tourniquet Time Documented: Upper Arm (Right) - 41 minutes Total: Upper Arm (Right) - 41 minutes  Thigh (Right) - 120 minutes Total: Thigh (Right) - 120 minutes   DICTATION: .Note written in EPIC  PLAN OF CARE: Admit to inpatient   PATIENT DISPOSITION:  PACU - hemodynamically stable.   Delay start of Pharmacological VTE agent (>24hrs) due to surgical blood loss or risk of bleeding: not applicable

## 2017-05-27 NOTE — Op Note (Signed)
Date of Surgery: 05/27/2017  INDICATIONS: Lindsey Ramos is a 22 y.o.-year-old female who sustained a right ankle fracture; she was indicated for open reduction and internal fixation due to the displaced nature of the articular fracture and came to the operating room today for this procedure. The patient and caregiver did consent to the procedure after discussion of the risks and benefits.  Of note the patient has been walking on her splinted right ankle for the past 3 weeks prior to surgery.  She has some mental incapacitation that makes it difficult for her to follow instructions.  We discussed that this would clearly increase the difficulty of the procedure and may also increase the risk of malunion or nonunion.  PREOPERATIVE DIAGNOSIS: right trimalleolar ankle fracture  POSTOPERATIVE DIAGNOSIS: Same.  PROCEDURE: Open treatment of right ankle fracture with internal fixation.  Trimalleolar w/o fixation of posterior malleolus   SURGEON: Shontelle Muska P. Aundria Rud, M.D.  Second Surgeon: Bradly Bienenstock, MD   ANESTHESIA:  general  TOURNIQUET TIME: 120 min   IV FLUIDS AND URINE: See anesthesia.  ESTIMATED BLOOD LOSS: 30 mL.  IMPLANTS:  Biomet alps distal fibula plate with 4.0 cancellus screws and 3.5 millimeter cortical nonlocking screws.  Two 4.0 mm cannulated screws medial  COMPLICATIONS: None.  Statement of necessity of second surgeon: Dr. Melvyn Novas scrubbed in to assist with the reduction and implantation of the fibula fixation.  His assistance was necessary due to the chronicity of this injury being 3 weeks out.  Also due to the fact that the patient had been ambulating on this making the fibula extremely shortened.  His expertise was necessary to help reestablish the appropriate length, rotation, and alignment and with applying fixation.  DESCRIPTION OF PROCEDURE: The patient was brought to the operating room and placed supine on the operating table.  The patient had been signed prior to the  procedure and this was documented. The patient had the anesthesia placed by the anesthesiologist.  A nonsterile tourniquet was placed on the upper thigh.  The prep verification and incision time-outs were performed to confirm that this was the correct patient, site, side and location. The patient had an SCD on the opposite lower extremity. The patient did receive antibiotics prior to the incision and was re-dosed during the procedure as needed at indicated intervals.  The patient had the lower extremity prepped and draped in the standard surgical fashion.  The extremity was exsanguinated using an esmarch bandage and the tourniquet was inflated to 300 mm Hg.   Incision was made over the distal fibula and the fracture was exposed and reduced anatomically with a clamp. A lag screw was placed.  Of note the initial lag screw loss fixation when the plate was fixed to the bone.  At this point Dr. Melvyn Novas scrubbed in to assist with takedown of the fibular plate and establishment of a better anatomic alignment after takedown of callus and reestablishment of the fibular length.  We then were able to apply a series of clamps and achieve anatomic alignment in place a interfragmentary screw.  I then applied a 1/3 tubular locking plate and secured it proximally and distally with non-locking screws. Bone quality was fair. I used c-arm to confirm satisfactory reduction and fixation.   I then turned my attention to the medial malleolus. Incision was made over the medial malleolus and the fracture exposed and held provisionally with a clamp. 2 guidepins were placed for the 4.0 mm cannulated screws and then confirmation of reduction was made  with fluoroscopy. I then placed 2  40mm screws which had satisfactory fixation.   The syndesmosis was stressed using live fluoroscopy and found to be stable.   I did independently perform and interpret an AP, lateral, mortise, and stress radiography of the right ankle.  The wounds were  irrigated, and closed with vicryl with routine closure for the skin. The wounds were injected with local anesthetic. Sterile gauze was applied followed by a posterior splint. She was awakened and returned to the PACU in stable and satisfactory condition. There were no complications.  All counts were correct x2.  POSTOPERATIVE PLAN: Lindsey Ramos will remain nonweightbearing on this leg for approximately 6 weeks; Lindsey Ramos will return for suture removal in 2 weeks.  He will be immobilized in a short leg splint and then transitioned to a CAM walker at his first follow up appointment.  Lindsey Ramos will receive DVT prophylaxis based on other medications, activity level, and risk ratio of bleeding to thrombosis.  She will be admitted to my service postoperatively for strict elevation, pain control, and physical and occupational therapy.  Maryan Rued, MD [facility] [phone] 10:39 AM

## 2017-05-28 ENCOUNTER — Encounter (HOSPITAL_COMMUNITY): Payer: Self-pay | Admitting: Orthopedic Surgery

## 2017-05-28 NOTE — Progress Notes (Signed)
Rehab Admissions Coordinator Note:  Patient was screened by Clois Dupes for appropriateness for an Inpatient Acute Rehab Consult per OT recommendation. At this time, we are recommending Skilled Nursing Facility or [facility]. Noted extensive SW follow up preop with group home. Do not feel pt meets medically necessary criteria for hospital acute rehab.   Clois Dupes 05/28/2017, 8:12 AM  I can be reached at [phone].

## 2017-05-28 NOTE — Progress Notes (Signed)
Orthopedics Progress Note  Subjective: Patient reports some ongoing right ankle pain but minimal pain in the right wrist  Objective:  Vitals:   05/27/17 2300 05/28/17 0600  BP: 139/72 (!) 119/57  Pulse:  85  Resp:  19  Temp:  98.1 F (36.7 C)  SpO2:      General: Awake and alert  Musculoskeletal: right UE splinted and elevated, sensation intact in the fingers which she is able to wiggle Right ankle splinted and elevated, sensation intact, wiggles toes with some pain Neurovascularly intact  Lab Results  Component Value Date   WBC 15.0 (H) 05/27/2017   HGB 12.7 05/27/2017   HCT 40.0 05/27/2017   MCV 93.5 05/27/2017   PLT 327 05/27/2017       Component Value Date/Time   NA 138 05/25/2017 1544   K 3.5 05/25/2017 1544   CL 104 05/25/2017 1544   CO2 24 05/25/2017 1544   GLUCOSE 107 (H) 05/25/2017 1544   BUN 9 05/25/2017 1544   CREATININE 0.75 05/27/2017 1134   CALCIUM 9.1 05/25/2017 1544   GFRNONAA >60 05/27/2017 1134   GFRAA >60 05/27/2017 1134    No results found for: INR, PROTIME  Assessment/Plan: POD #1 s/p Procedure(s): OPEN REDUCTION INTERNAL FIXATION (ORIF) ANKLE FRACTURE OPEN REDUCTION INTERNAL FIXATION (ORIF) WRIST FRACTURE Continue mobilization NWB on the right LE, used platform walker Discharge planning  Viviann Spare R. Ranell Patrick, MD 05/28/2017 10:26 AM

## 2017-05-28 NOTE — Progress Notes (Signed)
Physical Therapy Treatment Patient Details Name: Lindsey Ramos MRN: [id number] DOB: [birth year] Today's Date: 05/28/2017    History of Present Illness 22 yo female presented to ED with suicidal ideation and complaints about her living environment. Hx of bipolar d/o and fall on or around 05/14/17. Imaging (+) R trimalleolar ankle fx, R comminuted distal radius fx, and R minimally displaced ulnar fx. S/p ORIF of right of displaced intra-articular distal radius fx and ORIF of right ankle fx on 05/27/17.     PT Comments    Pt nervous about getting up and trying PFRW, though she used it at [place] on Saturday. Min A to stand but pt anxious and did not maintain standing, sat back to bed. Encouragement given and +2 for safety and pt stood again with min A and hopped on LLE with PFRW 4', needed encouragement to keep going another 4'. Chair brought behind her. Pt mildly light headed, had not received breakfast yet, RN aware. PT will continue to follow.\    Follow Up Recommendations  SNF     Equipment Recommendations  Wheelchair (measurements PT);Rolling walker with 5" wheels;Other (comment)(R platform for RW)    Recommendations for Other Services       Precautions / Restrictions Precautions Precautions: Fall Required Braces or Orthoses: Other Brace/Splint Other Brace/Splint: splint/cast R arm and R lower leg Restrictions Weight Bearing Restrictions: Yes RUE Weight Bearing: Weight bear through elbow only RLE Weight Bearing: Non weight bearing    Mobility  Bed Mobility Overal bed mobility: Modified Independent             General bed mobility comments: pt able to sit up from flat bed and get to edge wirthout use of rail  Transfers Overall transfer level: Needs assistance Equipment used: Right platform walker Transfers: Sit to/from Stand Sit to Stand: Min assist;+2 safety/equipment         General transfer comment: pt nervous about standing to PFRW, stood once and imiediately  sat back down. Needed encouragement to get back up. Once she began, needed only min A to steady  Ambulation/Gait Ambulation/Gait assistance: Min assist;+2 safety/equipment Ambulation Distance (Feet): 6 Feet Assistive device: Right platform walker Gait Pattern/deviations: Step-to pattern Gait velocity: decreased Gait velocity interpretation: <1.31 ft/sec, indicative of household ambulator General Gait Details: pt able to keep RLE NWB but was anxious throughout and reported mild light headedness and hunger. Pt wanted to stop at 4' but was encouraged to go a bit further.    Stairs             Wheelchair Mobility    Modified Rankin (Stroke Patients Only)       Balance Overall balance assessment: History of Falls;Needs assistance Sitting-balance support: No upper extremity supported;Feet supported Sitting balance-Leahy Scale: Good     Standing balance support: Bilateral upper extremity supported Standing balance-Leahy Scale: Poor Standing balance comment: Reliant on UE support                            Cognition Arousal/Alertness: Awake/alert Behavior During Therapy: Flat affect;Anxious Overall Cognitive Status: No family/caregiver present to determine baseline cognitive functioning Area of Impairment: Attention;Following commands;Problem solving;Awareness                   Current Attention Level: Selective   Following Commands: Follows one step commands with increased time;Follows multi-step commands inconsistently   Awareness: Emergent Problem Solving: Slow processing;Requires verbal cues General Comments: Feel this is close  to pt's baseline cognition. Pt requiring increased time for processing. Benefits from positive words, simple commands, and encouragement.      Exercises      General Comments        Pertinent Vitals/Pain Pain Assessment: Faces Faces Pain Scale: Hurts little more Pain Location: R ankle Pain Descriptors / Indicators:  Aching Pain Intervention(s): Limited activity within patient's tolerance;Monitored during session;Patient requesting pain meds-RN notified    Home Living                      Prior Function            PT Goals (current goals can now be found in the care plan section) Acute Rehab PT Goals Patient Stated Goal: walk again PT Goal Formulation: With patient Time For Goal Achievement: 06/09/17 Potential to Achieve Goals: Good Progress towards PT goals: Progressing toward goals    Frequency    Min 3X/week      PT Plan Current plan remains appropriate    Co-evaluation              AM-PAC PT "6 Clicks" Daily Activity  Outcome Measure  Difficulty turning over in bed (including adjusting bedclothes, sheets and blankets)?: None Difficulty moving from lying on back to sitting on the side of the bed? : A Little Difficulty sitting down on and standing up from a chair with arms (e.g., wheelchair, bedside commode, etc,.)?: Unable Help needed moving to and from a bed to chair (including a wheelchair)?: A Little Help needed walking in hospital room?: A Little Help needed climbing 3-5 steps with a railing? : Total 6 Click Score: 15    End of Session Equipment Utilized During Treatment: Gait belt Activity Tolerance: Patient tolerated treatment well Patient left: in chair;with call bell/phone within reach Nurse Communication: Mobility status;Other (comment);Patient requests pain meds(wanting breakfast) PT Visit Diagnosis: History of falling (Z91.81);Other abnormalities of gait and mobility (R26.89);Difficulty in walking, not elsewhere classified (R26.2);Pain Pain - Right/Left: Right Pain - part of body: Arm;Ankle and joints of foot     Time: 0917-0929 PT Time Calculation (min) (ACUTE ONLY): 12 min  Charges:  $Gait Training: 8-22 mins                    G Codes:       [name], PT  Acute Rehab Services  [phone]    Lawana Chambers Grantley Savage 05/28/2017, 10:13  AM

## 2017-05-28 NOTE — Social Work (Addendum)
CSW left 30 day note for doctor to sign on chart for PASSR for SNF placement.  CSW f/u for disposition.  Keene Breath, LCSW Clinical Social Worker [phone]

## 2017-05-29 ENCOUNTER — Encounter (HOSPITAL_COMMUNITY): Payer: Self-pay | Admitting: *Deleted

## 2017-05-29 NOTE — Care Management Important Message (Signed)
Important Message  Patient Details  Name: Lindsey Ramos MRN: [id number] Date of Birth: 1995-09-18   Medicare Important Message Given:  Yes    Jaskarn Schweer 05/29/2017, 2:03 PM

## 2017-05-29 NOTE — Social Work (Signed)
CSW went to meet patient at bedside however, patient was resting.  CSW will f/u.  Keene Breath, LCSW Clinical Social Worker [phone]

## 2017-05-29 NOTE — Care Management Important Message (Signed)
Important Message  Patient Details  Name: Lindsey Ramos MRN: [id number] Date of Birth: 07-Sep-1995   Medicare Important Message Given:  No Correction Patient not able to sign/Unsigned copy left   [name] 05/29/2017, 2:06 PM

## 2017-05-29 NOTE — Plan of Care (Signed)
  Problem: Health Behavior/Discharge Planning: Goal: Ability to manage health-related needs will improve Outcome: Progressing   Problem: Activity: Goal: Risk for activity intolerance will decrease Outcome: Progressing   Problem: Pain Managment: Goal: General experience of comfort will improve Outcome: Progressing   

## 2017-05-29 NOTE — Social Work (Signed)
CSW discussed case with Legal guardian today. CSW will f/u for SNF offers for placement and contact guardian to discuss.  In addition, guardian inquiring about psych f/u. Guardian will f/u with floor nurse for additional information.  CS wf/u for disposition.  Keene Breath, LCSW Clinical Social Worker [phone]

## 2017-05-29 NOTE — Progress Notes (Signed)
Subjective: 2 Days Post-Op Procedure(s) (LRB): OPEN REDUCTION INTERNAL FIXATION (ORIF) ANKLE FRACTURE (Right) OPEN REDUCTION INTERNAL FIXATION (ORIF) WRIST FRACTURE (Right) Patient reports pain as 8 on 0-10 scale.   Patient is sitting comfortably in the chair but states that her pain is 8-10. She feels that the medicine is controlling the pain.  She is tolerating food and liquid with no difficulty.  She denies chest pain, shortness of breath, fever, chills, nausea, vomiting, or diarrhea.  Objective: Vital signs in last 24 hours: Temp:  [98.3 F (36.8 C)] 98.3 F (36.8 C) (05/28 0500) Pulse Rate:  [86] 86 (05/28 0500) Resp:  [16-20] 16 (05/28 0500) BP: (110-119)/(64-66) 110/66 (05/28 0500) SpO2:  [98 %-100 %] 98 % (05/28 0500)  Intake/Output from previous day: 05/27 0701 - 05/28 0700 In: 1510 [P.O.:960; I.V.:550] Out: -  Intake/Output this shift: Total I/O In: 600 [P.O.:600] Out: -   Recent Labs    05/27/17 1134  HGB 12.7   Recent Labs    05/27/17 1134  WBC 15.0*  RBC 4.28  HCT 40.0  PLT 327   Recent Labs    05/27/17 1134  CREATININE 0.75   No results for input(s): LABPT, INR in the last 72 hours.  Patient is awake and alert Neurovascular intact Sensation intact distally Splint is C/D/I. Splint on RLE and RUE Able to wiggle all fingers without difficulty.    Assessment/Plan: 2 Days Post-Op Procedure(s) (LRB): OPEN REDUCTION INTERNAL FIXATION (ORIF) ANKLE FRACTURE (Right) OPEN REDUCTION INTERNAL FIXATION (ORIF) WRIST FRACTURE (Right) Advance diet  Continue with splints and keep them clean and dry.  Continue with pain management as needed.      Karma Greaser 05/29/2017, 4:25 PM

## 2017-05-29 NOTE — Progress Notes (Signed)
Physical Therapy Treatment Patient Details Name: Lindsey Ramos MRN: [id number] DOB: 1995/04/01 Today's Date: 05/29/2017    History of Present Illness 22 yo female presented to ED with suicidal ideation and complaints about her living environment. Hx of bipolar d/o and fall on or around 05/14/17. Imaging (+) R trimalleolar ankle fx, R comminuted distal radius fx, and R minimally displaced ulnar fx. S/p ORIF of right of displaced intra-articular distal radius fx and ORIF of right ankle fx on 05/27/17.     PT Comments    Pt ambulated 12' with PFRW and min A +2 for equipment and encouragement. Pt maintained standing at sink for work with OT, able to keep RLE NWB throughout. Continued education given on caring for RUE and LE in regards to NWB status. PT will continue to follow.    Follow Up Recommendations  SNF     Equipment Recommendations  Wheelchair (measurements PT);Rolling walker with 5" wheels;Other (comment)(R platform on RW)    Recommendations for Other Services       Precautions / Restrictions Precautions Precautions: Fall Required Braces or Orthoses: Other Brace/Splint Other Brace/Splint: splint/cast R arm and R lower leg Restrictions Weight Bearing Restrictions: Yes RUE Weight Bearing: Weight bear through elbow only RLE Weight Bearing: Non weight bearing    Mobility  Bed Mobility Overal bed mobility: Modified Independent             General bed mobility comments: pt able to sit up from flat bed and get to edge without use of rail  Transfers Overall transfer level: Needs assistance Equipment used: Right platform walker Transfers: Sit to/from Stand Sit to Stand: +2 safety/equipment;Min guard         General transfer comment: pt able to stand keeping RLE NWB. She is mildly impulsive and sits without attending to R arm up on platform, vc's given. Pt reports that she is nervous in standing but no LOB or insufficiency of L knee/  hip  Ambulation/Gait Ambulation/Gait assistance: Min assist;+2 safety/equipment Ambulation Distance (Feet): 40 Feet Assistive device: Right platform walker Gait Pattern/deviations: Step-to pattern Gait velocity: decreased Gait velocity interpretation: <1.31 ft/sec, indicative of household ambulator General Gait Details: pt hopped to sink and then continued to hallway and back to chair. Pt reports less R ankle pain with ambulation today than yesterday. Spoke to her about using L shoe for cushioning on next visit   Stairs             Wheelchair Mobility    Modified Rankin (Stroke Patients Only)       Balance Overall balance assessment: History of Falls;Needs assistance Sitting-balance support: No upper extremity supported;Feet supported Sitting balance-Leahy Scale: Good     Standing balance support: Bilateral upper extremity supported Standing balance-Leahy Scale: Poor Standing balance comment: Reliant on UE support due to NWB RUE/LE                            Cognition Arousal/Alertness: Lethargic Behavior During Therapy: Flat affect Overall Cognitive Status: No family/caregiver present to determine baseline cognitive functioning Area of Impairment: Problem solving                   Current Attention Level: Selective   Following Commands: Follows one step commands consistently;Follows one step commands with increased time   Awareness: Emergent Problem Solving: Slow processing;Requires verbal cues General Comments: pt shows low motivation to due things for herself but will do them when encouraged. When  asked what she would like to do when she gets out of the hospital or what motivates her, she said "smoking". She does mention later that she likes yogs      Exercises General Exercises - Lower Extremity Quad Sets: Other (comment)(encouraged toe wiggling R foot) Long Arc Quad: AROM;Right;10 reps;Seated Straight Leg Raises: AROM;Right;5  reps;Seated    General Comments General comments (skin integrity, edema, etc.): pt reports R ankle feeling swollen and tight, especially medial side      Pertinent Vitals/Pain Pain Assessment: Faces Faces Pain Scale: Hurts little more Pain Location: R ankle Pain Descriptors / Indicators: Aching Pain Intervention(s): Limited activity within patient's tolerance;Monitored during session    Home Living                      Prior Function            PT Goals (current goals can now be found in the care plan section) Acute Rehab PT Goals Patient Stated Goal: walk again PT Goal Formulation: With patient Time For Goal Achievement: 06/09/17 Potential to Achieve Goals: Good Progress towards PT goals: Progressing toward goals    Frequency    Min 3X/week      PT Plan Current plan remains appropriate    Ramos-evaluation PT/OT/SLP Ramos-Evaluation/Treatment: Yes Reason for Ramos-Treatment: To address functional/ADL transfers;Necessary to address cognition/behavior during functional activity PT goals addressed during session: Mobility/safety with mobility;Balance;Proper use of DME;Strengthening/ROM        AM-PAC PT "6 Clicks" Daily Activity  Outcome Measure  Difficulty turning over in bed (including adjusting bedclothes, sheets and blankets)?: None Difficulty moving from lying on back to sitting on the side of the bed? : A Little Difficulty sitting down on and standing up from a chair with arms (e.g., wheelchair, bedside commode, etc,.)?: Unable Help needed moving to and from a bed to chair (including a wheelchair)?: A Little Help needed walking in hospital room?: A Little Help needed climbing 3-5 steps with a railing? : Total 6 Click Score: 15    End of Session Equipment Utilized During Treatment: Gait belt Activity Tolerance: Patient tolerated treatment well Patient left: in chair;with call bell/phone within reach;with nursing/sitter in room Nurse Communication: Mobility  status PT Visit Diagnosis: History of falling (Z91.81);Other abnormalities of gait and mobility (R26.89);Difficulty in walking, not elsewhere classified (R26.2);Pain Pain - Right/Left: Right Pain - part of body: Arm;Ankle and joints of foot     Time: 1510-1544 PT Time Calculation (min) (ACUTE ONLY): 34 min  Charges:  $Gait Training: 8-22 mins                    G Codes:       Lindsey Ramos, PT  Acute Rehab Services  [phone]    Lindsey Ramos 05/29/2017, 4:07 PM

## 2017-05-29 NOTE — Progress Notes (Addendum)
Occupational Therapy Treatment Patient Details Name: Lindsey Ramos MRN: [id number] DOB: Jun 24, 1995 Today's Date: 05/29/2017    History of present illness 22 yo female presented to ED with suicidal ideation and complaints about her living environment. Hx of bipolar d/o and fall on or around 05/14/17. Imaging (+) R trimalleolar ankle fx, R comminuted distal radius fx, and R minimally displaced ulnar fx. S/p ORIF of right of displaced intra-articular distal radius fx and ORIF of right ankle fx on 05/27/17.    OT comments  Pt demonstrating progress toward OT goals this session. She was seen in conjunction with PT to facilitate progress with standing ADL and to further motivate pt to participate as pt is highly fearful in standing positions. She was able to complete standing grooming tasks with min guard assist to brush her teeth today. Encouraged use of B UE to open containers and set-up toothbrush and toothpaste. Lindsey Ramos was additionally able to ambulate in room with min assist +2 for safety with cues to slow down and maintain stability. She demonstrated the ability to wash foot and change sock today without assistance but continues to require assistance for pulling LB clothing over hips in standing position. Pt will benefit from post-acute rehabilitation to maximize return to PLOF.     Follow Up Recommendations  Supervision/Assistance - 24 hour;SNF    Equipment Recommendations  Other (comment)(defer to next venue of care)    Recommendations for Other Services Rehab consult;PT consult    Precautions / Restrictions Precautions Precautions: Fall Required Braces or Orthoses: Other Brace/Splint Other Brace/Splint: splint/cast R arm and R lower leg Restrictions Weight Bearing Restrictions: Yes RUE Weight Bearing: Weight bear through elbow only RLE Weight Bearing: Non weight bearing       Mobility Bed Mobility Overal bed mobility: Modified Independent             General bed mobility  comments: pt able to sit up from flat bed and get to edge without use of rail  Transfers Overall transfer level: Needs assistance Equipment used: Right platform walker Transfers: Sit to/from Stand Sit to Stand: +2 safety/equipment;Min guard         General transfer comment: pt able to stand keeping RLE NWB. She is mildly impulsive and sits without attending to R arm up on platform, vc's given. Pt reports that she is nervous in standing but no LOB or insufficiency of L knee/ hip    Balance Overall balance assessment: History of Falls;Needs assistance Sitting-balance support: No upper extremity supported;Feet supported Sitting balance-Leahy Scale: Good     Standing balance support: Bilateral upper extremity supported Standing balance-Leahy Scale: Poor Standing balance comment: Reliant on UE support due to NWB RUE/LE                           ADL either performed or assessed with clinical judgement   ADL Overall ADL's : Needs assistance/impaired     Grooming: Min guard;Standing;Oral care Grooming Details (indicate cue type and reason): Able to stand with close min guard to complete oral care tasks at sink. WB through elbow at sink.              Lower Body Dressing: Sit to/from stand;Minimal assistance Lower Body Dressing Details (indicate cue type and reason): Able to don socks in bed without assistance. Assistance required for standing to maintain balance while pulling lower body clothing over hips.  Toilet Transfer: Minimal assistance;Ambulation;RW;+2 for safety/equipment(R platform RW) Statistician Details (  indicate cue type and reason): simulated with sit<>stand followed by ambulation within room.          Functional mobility during ADLs: Minimal assistance;Rolling walker;+2 for safety/equipment General ADL Comments: +2 assist available as progressing with mobility today.      Vision       Perception     Praxis      Cognition Arousal/Alertness:  Lethargic Behavior During Therapy: Flat affect Overall Cognitive Status: No family/caregiver present to determine baseline cognitive functioning Area of Impairment: Problem solving                   Current Attention Level: Selective   Following Commands: Follows one step commands consistently;Follows one step commands with increased time   Awareness: Emergent Problem Solving: Slow processing;Requires verbal cues General Comments: pt shows low motivation to due things for herself but will do them when encouraged. When asked what she would like to do when she gets out of the hospital or what motivates her, she said "smoking". She does mention later that she likes yogs        Exercises Exercises: Other exercises General Exercises - Lower Extremity Quad Sets: Other (comment)(encouraged toe wiggling R foot) Long Arc Quad: AROM;Right;10 reps;Seated Straight Leg Raises: AROM;Right;5 reps;Seated Other Exercises Other Exercises: Encouraged R shoulder AROM as well as R digit AROM throughout the day.    Shoulder Instructions       General Comments pt reports R ankle feeling swollen and tight, especially medial side    Pertinent Vitals/ Pain       Pain Assessment: Faces Faces Pain Scale: Hurts little more Pain Location: R ankle Pain Descriptors / Indicators: Aching Pain Intervention(s): Limited activity within patient's tolerance;Monitored during session  Home Living                                          Prior Functioning/Environment              Frequency  Min 3X/week        Progress Toward Goals  OT Goals(current goals can now be found in the care plan section)  Progress towards OT goals: Progressing toward goals  Acute Rehab OT Goals Patient Stated Goal: walk again and do yoga again OT Goal Formulation: With patient Time For Goal Achievement: 06/10/17 Potential to Achieve Goals: Good  Plan Discharge plan remains appropriate     Co-evaluation    PT/OT/SLP Co-Evaluation/Treatment: Yes Reason for Co-Treatment: To address functional/ADL transfers;Necessary to address cognition/behavior during functional activity PT goals addressed during session: Mobility/safety with mobility;Balance;Proper use of DME;Strengthening/ROM OT goals addressed during session: ADL's and self-care;Strengthening/ROM      AM-PAC PT "6 Clicks" Daily Activity     Outcome Measure   Help from another person eating meals?: A Little Help from another person taking care of personal grooming?: A Little Help from another person toileting, which includes using toliet, bedpan, or urinal?: A Little Help from another person bathing (including washing, rinsing, drying)?: A Little Help from another person to put on and taking off regular upper body clothing?: A Little Help from another person to put on and taking off regular lower body clothing?: A Little 6 Click Score: 18    End of Session Equipment Utilized During Treatment: Gait belt  OT Visit Diagnosis: Unsteadiness on feet (R26.81);Other abnormalities of gait and mobility (R26.89);Muscle weakness (generalized) (M62.81);Pain Pain -  Right/Left: Right Pain - part of body: Arm;Hand;Leg;Ankle and joints of foot   Activity Tolerance Patient tolerated treatment well   Patient Left in chair;with call bell/phone within reach;with nursing/sitter in room   Nurse Communication          Time: 1610-9604 OT Time Calculation (min): 30 min  Charges: OT General Charges $OT Visit: 1 Visit OT Treatments $Self Care/Home Management : 8-22 mins  Doristine Section, MS OTR/L  Pager: [phone]    Corryn Madewell A Kalya Troeger 05/29/2017, 5:22 PM

## 2017-05-30 MED ORDER — NICOTINE POLACRILEX 2 MG MT GUM
4.0000 mg | CHEWING_GUM | OROMUCOSAL | Status: DC | PRN
  Administered 2017-05-30 (×3): 4 mg via ORAL
  Filled 2017-05-30 (×13): qty 2

## 2017-05-30 MED ORDER — NICOTINE POLACRILEX 2 MG MT GUM
2.0000 mg | CHEWING_GUM | OROMUCOSAL | Status: DC | PRN
  Administered 2017-05-31: 2 mg via ORAL
  Filled 2017-05-30: qty 1

## 2017-05-30 NOTE — Social Work (Addendum)
CSW f/u on disposition.  CSW met with patient at bedside and discussed plan for SNF. Pt in agreement.  CSW indicated the patient needs to be cleared so that she no longer needs a sitter and CSW can assist with getting patient placement in SNF.  CSW awaiting SNF offers, however sitter needs to be addressed.  CSW advised floor RN of same.  12:19pm Faxed clinicals to [facility] for PASSR.   Elissa Hefty, LCSW Clinical Social Worker [phone]

## 2017-05-30 NOTE — Progress Notes (Signed)
Spoke to Dr. Aundria Rud over the phone regarding order for Suicide sitter for patient. There are no current orders for suicide precaution and no psych evaluation. Dr. Aundria Rud said that when the patient was in the ED at [facility], psych evaluated her there and cleared her from any suicidal ideations. He said therefore, we do not need a suicide sitter here or any precautions. He said he saw her yesterday and she looked good to him. Sitter will be d/c'd at this time per Dr. Aundria Rud and I will notify social worker so she can work on placement.

## 2017-05-30 NOTE — Plan of Care (Signed)

## 2017-05-30 NOTE — Social Work (Signed)
CSW verbally informed by bedside RN Ginger (also documented on progress note at 12:21 on 5/29)  that pt has had sitter d/c by MD. Pt was cleared psychiatrically at [facility] and does not warrant the need for bedside sitter. CSW continuing to seek placement, will speak with pt guardian when offers available.   Doy Hutching, LCSWA [facility] Clinical Social Work [phone]

## 2017-05-30 NOTE — Social Work (Signed)
CSW spoke with Lindsey Ramos, with [facility] she will come and assess pt for level II PASSR likely tomorrow.  CSW continuing to follow, pt currently does not have any SNF offers and PASSR is pending.   Doy Hutching, LCSWA [facility] Clinical Social Work [phone]

## 2017-05-30 NOTE — NC FL2 (Signed)
[place] MEDICAID FL2 LEVEL OF CARE SCREENING TOOL     IDENTIFICATION  Patient Name: Lindsey Ramos Birthdate: [birth year] Sex: female Admission Date (Current Location): 05/25/2017  [facility] and [place] Number:  Producer, television/film/video and Address:  [facility], [address] [place], [place] [address]      Provider Number: [id number]  Attending Physician Name and Address:  Yolonda Kida, MD  Relative Name and Phone Number:  Marcene Corning, guardian, [phone]    Current Level of Care: Hospital Recommended Level of Care: Skilled Nursing Facility Prior Approval Number:    Date Approved/Denied:   PASRR Number: pending  Discharge Plan: SNF    Current Diagnoses: Patient Active Problem List   Diagnosis Date Noted  . Closed fracture of right distal radius 05/27/2017  . Closed displaced trimalleolar fracture of right ankle 05/26/2017    Orientation RESPIRATION BLADDER Height & Weight     Self, Time, Situation, Place  Normal Continent Weight: 180 lb (81.6 kg) Height:   (154.9 cm)  BEHAVIORAL SYMPTOMS/MOOD NEUROLOGICAL BOWEL NUTRITION STATUS      Continent Diet(See DC summary)  AMBULATORY STATUS COMMUNICATION OF NEEDS Skin   Limited Assist(min A +2 for equipment) Verbally Surgical wounds                       Personal Care Assistance Level of Assistance  Dressing, Bathing, Feeding Bathing Assistance: Limited assistance Feeding assistance: Independent Dressing Assistance: Limited assistance     Functional Limitations Info  Sight, Hearing, Speech Sight Info: Adequate Hearing Info: Adequate Speech Info: Adequate    SPECIAL CARE FACTORS FREQUENCY  PT (By licensed PT), OT (By licensed OT)     PT Frequency: 3x week OT Frequency: 3x week            Contractures      Additional Factors Info  Code Status, Allergies, Psychotropic Code Status Info: Full Allergies Info: No Psychotropic Info: Remeron, Zoloft          Current Medications (05/30/2017):  This is the current hospital active medication list Current Facility-Administered Medications  Medication Dose Route Frequency Provider Last Rate Last Dose  . acetaminophen (TYLENOL) tablet 650 mg  650 mg Oral Q6H PRN Yolonda Kida, MD   650 mg at 05/30/17 0957   Or  . acetaminophen (TYLENOL) suppository 650 mg  650 mg Rectal Q6H PRN Yolonda Kida, MD      . docusate sodium (COLACE) capsule 100 mg  100 mg Oral BID Yolonda Kida, MD   100 mg at 05/30/17 2725  . enoxaparin (LOVENOX) injection 40 mg  40 mg Subcutaneous Q24H Yolonda Kida, MD   40 mg at 05/30/17 3664  . HYDROcodone-acetaminophen (NORCO/VICODIN) 5-325 MG per tablet 1-2 tablet  1-2 tablet Oral Q4H PRN Yolonda Kida, MD   2 tablet at 05/30/17 [phone]  . ibuprofen (ADVIL,MOTRIN) tablet 600 mg  600 mg Oral Q6H PRN Yolonda Kida, MD   600 mg at 05/29/17 1156  . lactated ringers infusion   Intravenous Continuous Yolonda Kida, MD 50 mL/hr at 05/27/17 1700    . methocarbamol (ROBAXIN) tablet 500 mg  500 mg Oral Q6H PRN Yolonda Kida, MD   500 mg at 05/30/17 7425   Or  . methocarbamol (ROBAXIN) 500 mg in dextrose 5 % 50 mL IVPB  500 mg Intravenous Q6H PRN Yolonda Kida, MD      . metoCLOPramide [facility])  tablet 5-10 mg  5-10 mg Oral Q8H PRN Yolonda Kida, MD       Or  . metoCLOPramide [facility]) injection 5-10 mg  5-10 mg Intravenous Q8H PRN Yolonda Kida, MD      . mirtazapine (REMERON) tablet 30 mg  30 mg Oral QHS Yolonda Kida, MD   30 mg at 05/29/17 2230  . nicotine (NICODERM CQ - dosed in mg/24 hours) patch 14 mg  14 mg Transdermal Daily Yolonda Kida, MD   14 mg at 05/30/17 4098  . ondansetron (ZOFRAN) tablet 4 mg  4 mg Oral Q6H PRN Yolonda Kida, MD   4 mg at 05/29/17 1624   Or  . ondansetron [facility]) injection 4 mg  4 mg Intravenous Q6H PRN Yolonda Kida, MD      . povidone-iodine 10  % swab 2 application  2 application Topical Once Yolonda Kida, MD      . sertraline (ZOLOFT) tablet 100 mg  100 mg Oral Daily Yolonda Kida, MD   100 mg at 05/30/17 1191     Discharge Medications: Please see discharge summary for a list of discharge medications.  Relevant Imaging Results:  Relevant Lab Results:   Additional Information SS#: [id number]  Tresa Moore, LCSW

## 2017-05-30 NOTE — Progress Notes (Signed)
Orthopedics Progress Note  Subjective: Patient reports she is feeling a little sad, but denies SI/HI.  No CP/SOB.  Pain well cnotrolled with orals  Objective:  Vitals:   05/30/17 0626 05/30/17 1532  BP: 125/66 107/60  Pulse: 74 75  Resp: 18 20  Temp: 98.1 F (36.7 C)   SpO2: 97% 99%    General: Awake and alert  Musculoskeletal: right UE splinted and elevated, sensation intact in the fingers which she is able to wiggle Right ankle splinted and elevated, sensation intact, wiggles toes with some pain Neurovascularly intact  Lab Results  Component Value Date   WBC 15.0 (H) 05/27/2017   HGB 12.7 05/27/2017   HCT 40.0 05/27/2017   MCV 93.5 05/27/2017   PLT 327 05/27/2017       Component Value Date/Time   NA 138 05/25/2017 1544   K 3.5 05/25/2017 1544   CL 104 05/25/2017 1544   CO2 24 05/25/2017 1544   GLUCOSE 107 (H) 05/25/2017 1544   BUN 9 05/25/2017 1544   CREATININE 0.75 05/27/2017 1134   CALCIUM 9.1 05/25/2017 1544   GFRNONAA >60 05/27/2017 1134   GFRAA >60 05/27/2017 1134    No results found for: INR, PROTIME  Assessment/Plan: POD #1 s/p Procedure(s): OPEN REDUCTION INTERNAL FIXATION (ORIF) ANKLE FRACTURE OPEN REDUCTION INTERNAL FIXATION (ORIF) WRIST FRACTURE Continue mobilization NWB on the right LE, RUE used platform walker Discharge planning, likley to SNF tomorrow. - pain control - elevate RUE and RLE - lovenox for dvt ppx with SCD here and home on bid asa -   Yolonda Kida , MD  05/30/2017 3:42 PM

## 2017-05-31 MED ORDER — NICOTINE POLACRILEX 2 MG MT GUM
2.0000 mg | CHEWING_GUM | OROMUCOSAL | Status: DC | PRN
  Administered 2017-05-31 – 2017-06-03 (×17): 2 mg via ORAL
  Filled 2017-05-31 (×18): qty 1

## 2017-05-31 NOTE — Progress Notes (Signed)
Physical Therapy Treatment Patient Details Name: Lindsey Ramos MRN: [id number] DOB: 1995/03/03 Today's Date: 05/31/2017    History of Present Illness 22 yo female presented to ED with suicidal ideation and complaints about her living environment. Hx of bipolar d/o and fall on or around 05/14/17. Imaging (+) R trimalleolar ankle fx, R comminuted distal radius fx, and R minimally displaced ulnar fx. S/p ORIF of right of displaced intra-articular distal radius fx and ORIF of right ankle fx on 05/27/17.     PT Comments    Pt tolerating session well, not limited by pain although it is 9/10 in right wrist. Pt reports improved stability with transfers and RW use. Pt also cont to have difficulty with managing the RW during turns, more so to the left, but this is practiced in session multiple times. AMB distance is limited by fatigue due to high energy needs to perform aq hop-to gait. Pt performed basic exercises for HEP with good effort, but does required extensive tactile cues for correct performance of standing hip ABDCT. Pt making progress toward goals overall. Will continue to follow acutely.    Follow Up Recommendations  SNF     Equipment Recommendations  Wheelchair (measurements PT);Rolling walker with 5" wheels;Other (comment)(Rt platform walker )    Recommendations for Other Services       Precautions / Restrictions Precautions Precautions: Fall Required Braces or Orthoses: Other Brace/Splint Other Brace/Splint: splint/cast R arm and R lower leg Restrictions RUE Weight Bearing: Partial weight bearing RLE Weight Bearing: Non weight bearing    Mobility  Bed Mobility Overal bed mobility: Modified Independent Bed Mobility: Supine to Sit     Supine to sit: HOB elevated        Transfers Overall transfer level: Needs assistance Equipment used: Right platform walker Transfers: Sit to/from Stand Sit to Stand: Supervision;Modified independent (Device/Increase time)          General transfer comment: moderate to low effort, no LOB; 4 times in session  Ambulation/Gait Ambulation/Gait assistance: Min guard;Min assist Ambulation Distance (Feet): 46 Feet Assistive device: Right platform walker Gait Pattern/deviations: (Hop-to pattern; pt needs minA for RW management turning Right, Total Assist with RW turning Left)     General Gait Details: Practiced turning in room 6 times.    Stairs             Wheelchair Mobility    Modified Rankin (Stroke Patients Only)       Balance Overall balance assessment: History of Falls;Needs assistance                                          Cognition Arousal/Alertness: Awake/alert Behavior During Therapy: (mildly flat affect) Overall Cognitive Status: Within Functional Limits for tasks assessed                                        Exercises Other Exercises Other Exercises: Rt Hip Extension in standing AROM 1x10, RW for support  Other Exercises: Rt Hip abduction in standing AROM 1x10, RW for support, tactile cues for correct movement  Other Exercises: Seated Rt LAQ-1x10  Other Exercises: SEated Rt hip FLexion-1x10     General Comments        Pertinent Vitals/Pain Pain Assessment: 0-10 Pain Score: (9/10 in Rt wrist; 4/10 in Rt ankle )  Pain Location: *see above Pain Descriptors / Indicators: Aching Pain Intervention(s): Limited activity within patient's tolerance;Monitored during session;Repositioned    Home Living                      Prior Function            PT Goals (current goals can now be found in the care plan section) Acute Rehab PT Goals Patient Stated Goal: walk again and do yoga again PT Goal Formulation: With patient Time For Goal Achievement: 06/09/17 Potential to Achieve Goals: Good Progress towards PT goals: Progressing toward goals    Frequency    Min 3X/week      PT Plan Current plan remains appropriate     Co-evaluation              AM-PAC PT "6 Clicks" Daily Activity  Outcome Measure  Difficulty turning over in bed (including adjusting bedclothes, sheets and blankets)?: None Difficulty moving from lying on back to sitting on the side of the bed? : A Little Difficulty sitting down on and standing up from a chair with arms (e.g., wheelchair, bedside commode, etc,.)?: Unable Help needed moving to and from a bed to chair (including a wheelchair)?: A Little Help needed walking in hospital room?: A Little Help needed climbing 3-5 steps with a railing? : Total 6 Click Score: 15    End of Session Equipment Utilized During Treatment: Gait belt Activity Tolerance: Patient tolerated treatment well;No increased pain;Patient limited by fatigue Patient left: in chair;with call bell/phone within reach;with nursing/sitter in room Nurse Communication: Mobility status PT Visit Diagnosis: History of falling (Z91.81);Other abnormalities of gait and mobility (R26.89);Difficulty in walking, not elsewhere classified (R26.2);Pain Pain - Right/Left: Right Pain - part of body: Arm;Ankle and joints of foot     Time: 1610-9604 PT Time Calculation (min) (ACUTE ONLY): 24 min  Charges:  $Gait Training: 8-22 mins $Therapeutic Activity: 8-22 mins                    G Codes:       10:22 AM, 07-Jun-2017 Rosamaria Lints, PT, DPT Physical Therapist - [place] [phone] (Pager)  [phone] (Office)      Lindsey Ramos C 06/07/2017, 10:19 AM

## 2017-05-31 NOTE — Progress Notes (Signed)
Orthopedics Progress Note  Subjective: Patient reports she is feeling fine today.  No CP/SOB.  Pain well cnotrolled with orals  Objective:  Vitals:   05/31/17 1433 05/31/17 2003  BP: (!) 110/56 (!) 85/44  Pulse: 80 67  Resp:    Temp: 98.4 F (36.9 C) 98.6 F (37 C)  SpO2: 99% 100%    General: Awake and alert  Musculoskeletal: right UE splinted and elevated, sensation intact in the fingers which she is able to wiggle Right ankle splinted and elevated, sensation intact, wiggles toes with some pain Neurovascularly intact  Lab Results  Component Value Date   WBC 15.0 (H) 05/27/2017   HGB 12.7 05/27/2017   HCT 40.0 05/27/2017   MCV 93.5 05/27/2017   PLT 327 05/27/2017       Component Value Date/Time   NA 138 05/25/2017 1544   K 3.5 05/25/2017 1544   CL 104 05/25/2017 1544   CO2 24 05/25/2017 1544   GLUCOSE 107 (H) 05/25/2017 1544   BUN 9 05/25/2017 1544   CREATININE 0.75 05/27/2017 1134   CALCIUM 9.1 05/25/2017 1544   GFRNONAA >60 05/27/2017 1134   GFRAA >60 05/27/2017 1134    No results found for: INR, PROTIME  Assessment/Plan: POD #1 s/p Procedure(s): OPEN REDUCTION INTERNAL FIXATION (ORIF) ANKLE FRACTURE OPEN REDUCTION INTERNAL FIXATION (ORIF) WRIST FRACTURE Continue mobilization NWB on the right LE, RUE used platform walker Discharge planning, likley to SNF  - pain control - elevate RUE and RLE - lovenox for dvt ppx with SCD here and home on bid asa - pt was assessed in the ED at [facility] on presentation and found to have no need for psych eval, and was only admitted for management of her subacute wrist and ankle fractures.  Yolonda Kida , MD  05/31/2017 10:48 PM

## 2017-05-31 NOTE — Progress Notes (Signed)
Occupational Therapy Treatment Patient Details Name: Lindsey Ramos MRN: [id number] DOB: 21-Mar-1995 Today's Date: 05/31/2017    History of present illness 22 yo female presented to ED with suicidal ideation and complaints about her living environment. Hx of bipolar d/o and fall on or around 05/14/17. Imaging (+) R trimalleolar ankle fx, R comminuted distal radius fx, and R minimally displaced ulnar fx. S/p ORIF of right of displaced intra-articular distal radius fx and ORIF of right ankle fx on 05/27/17.    OT comments  Pt making progress with functional goals and was able to ambulate to bathroom using PFRW for toileting at grooming/hygiene at sink. After returning to recliner from bathroom, attempting to continue educating pt on toileting using A/E, however pt began talking on phone and would not get off phone when OT remonded her that session was not over yet. OT will continue to fooolw  Follow Up Recommendations  Supervision/Assistance - 24 hour;SNF    Equipment Recommendations  Other (comment)(TBD at next venue of care)    Recommendations for Other Services      Precautions / Restrictions Precautions Precautions: Fall Required Braces or Orthoses: Other Brace/Splint Other Brace/Splint: splint/cast R arm and R lower leg Restrictions Weight Bearing Restrictions: Yes RUE Weight Bearing: Partial weight bearing RLE Weight Bearing: Non weight bearing       Mobility Bed Mobility Overal bed mobility: Modified Independent Bed Mobility: Supine to Sit     Supine to sit: HOB elevated     General bed mobility comments: pt up in recliner  Transfers Overall transfer level: Needs assistance Equipment used: Right platform walker Transfers: Sit to/from Stand Sit to Stand: Supervision;Modified independent (Device/Increase time)         General transfer comment: moderate to low effort, no LOB; 4 times in session    Balance Overall balance assessment: History of Falls;Needs  assistance Sitting-balance support: No upper extremity supported;Feet supported Sitting balance-Leahy Scale: Good     Standing balance support: Bilateral upper extremity supported;During functional activity Standing balance-Leahy Scale: Poor                             ADL either performed or assessed with clinical judgement   ADL Overall ADL's : Needs assistance/impaired     Grooming: Min guard;Standing;Wash/dry hands           Upper Body Dressing : Sitting;Min guard       Toilet Transfer: Min guard;Ambulation;RW;Cueing for safety;Regular Toilet(PFRW)   Toileting- Clothing Manipulation and Hygiene: Moderate assistance;Sit to/from stand Toileting - Clothing Manipulation Details (indicate cue type and reason): total A with hygiene after BM.      Functional mobility during ADLs: Modified independent;Min guard General ADL Comments: after returning to recliner from bathroom, attempting to continue educating pt on toileting using A/E, however pt began talking on phone and would not get off phone when OT remonded her that session was not over yet.     Vision Patient Visual Report: No change from baseline     Perception     Praxis      Cognition Arousal/Alertness: Awake/alert Behavior During Therapy: (mildly flat affect) Overall Cognitive Status: Within Functional Limits for tasks assessed Area of Impairment: Problem solving                       Following Commands: Follows one step commands consistently;Follows one step commands with increased time     Problem Solving: Slow  processing;Requires verbal cues General Comments: attempting to continue educating pt on toileting using A/E, however pt began talking on phone and would not get off phone when OT remonded her that session was not over yet.        Exercises Other Exercises Other Exercises: Rt Hip Extension in standing AROM 1x10, RW for support  Other Exercises: Rt Hip abduction in standing  AROM 1x10, RW for support, tactile cues for correct movement  Other Exercises: Seated Rt LAQ-1x10  Other Exercises: SEated Rt hip FLexion-1x10    Shoulder Instructions       General Comments      Pertinent Vitals/ Pain       Pain Assessment: 0-10 Pain Score: (9/10 in Rt wrist; 4/10 in Rt ankle ) Pain Location: *see above Pain Descriptors / Indicators: Aching Pain Intervention(s): Limited activity within patient's tolerance;Monitored during session;Repositioned  Home Living                                          Prior Functioning/Environment              Frequency  Min 3X/week        Progress Toward Goals  OT Goals(current goals can now be found in the care plan section)  Progress towards OT goals: Progressing toward goals  Acute Rehab OT Goals Patient Stated Goal: walk again and do yoga again  Plan Discharge plan remains appropriate    Co-evaluation                 AM-PAC PT "6 Clicks" Daily Activity     Outcome Measure   Help from another person eating meals?: A Little Help from another person taking care of personal grooming?: A Little Help from another person toileting, which includes using toliet, bedpan, or urinal?: A Lot Help from another person bathing (including washing, rinsing, drying)?: A Little Help from another person to put on and taking off regular upper body clothing?: A Little Help from another person to put on and taking off regular lower body clothing?: A Little 6 Click Score: 17    End of Session Equipment Utilized During Treatment: Gait belt;Rolling walker  OT Visit Diagnosis: Unsteadiness on feet (R26.81);Other abnormalities of gait and mobility (R26.89);Muscle weakness (generalized) (M62.81);Pain Pain - Right/Left: Right Pain - part of body: Arm;Hand;Ankle and joints of foot;Leg   Activity Tolerance Patient tolerated treatment well   Patient Left in chair;with call bell/phone within reach   Nurse  Communication      Functional Assessment Tool Used: AM-PAC 6 Clicks Daily Activity   Time: 1610-9604 OT Time Calculation (min): 38 min  Charges: OT G-codes **NOT FOR INPATIENT CLASS** Functional Assessment Tool Used: AM-PAC 6 Clicks Daily Activity OT General Charges $OT Visit: 1 Visit OT Treatments $Self Care/Home Management : 23-37 mins $Therapeutic Activity: 8-22 mins     Galen Manila 05/31/2017, 11:54 AM

## 2017-05-31 NOTE — Social Work (Signed)
CSW discussed disposition at LOS meeting today and was advised by medical director to discuss SNF beed with regional administrators at [facility] and [address] as well as [facility] [place] if we cannot get patient placed locally. CSW f/u as patient was previously denied by SNF's. SNF admission staff,michelle indicated psych evaluation will be needed before then can extend a SNF bed. In review, appears no psych evaluation was ordered and sitter was discontinued.    CSW then contacted [facility], who will review and let CSW know outcome.  CSW f/u for passr as well.  Keene Breath, LCSW Clinical Social Worker [phone]

## 2017-06-01 ENCOUNTER — Other Ambulatory Visit: Payer: Medicaid Other

## 2017-06-01 NOTE — Social Work (Addendum)
CSW f/u for SNF bed at [facility]. CSW was advised that they cannot offer a SNF bed at this time due to patient's mental health.  CSW will f/u with [facility].  2:36pm: CSW received call back from [place] from [facility] and she advised that they will not offer patient a SNF bed due to s/i that was documented in medical records. CSW explained and advocated for patient, however unable to get her a SNF bed.  CSW then called advocate/guardian Kepe [phone] and left message requesting a call back. Pt will more than likely need to return to Group Home or Kepe will need to assist with placement as advocate indicated that mom can take patient home until they find her new group home placement.  UJWJX#[id number] F received.  Keene Breath, LCSW Clinical Social Worker [phone]

## 2017-06-02 MED ORDER — MAGNESIUM HYDROXIDE 400 MG/5ML PO SUSP
5.0000 mL | Freq: Every day | ORAL | Status: DC | PRN
  Administered 2017-06-02: 5 mL via ORAL
  Filled 2017-06-02: qty 30

## 2017-06-02 NOTE — Progress Notes (Signed)
    Subjective: 6 Days Post-Op Procedure(s) (LRB): OPEN REDUCTION INTERNAL FIXATION (ORIF) ANKLE FRACTURE (Right) OPEN REDUCTION INTERNAL FIXATION (ORIF) WRIST FRACTURE (Right) Patient reports pain as 2 on 0-10 scale.   Denies CP or SOB.  Voiding without difficulty. Positive flatus. Objective: Vital signs in last 24 hours: Temp:  [97.6 F (36.4 C)-98.6 F (37 C)] 97.6 F (36.4 C) (06/01 0457) Pulse Rate:  [60-82] 60 (06/01 0457) Resp:  [16] 16 (05/31 1406) BP: (96-118)/(53-64) 96/53 (06/01 0457) SpO2:  [99 %-100 %] 99 % (06/01 0457)  Intake/Output from previous day: 05/31 0701 - 06/01 0700 In: 720 [P.O.:720] Out: -  Intake/Output this shift: No intake/output data recorded.  Labs: No results for input(s): HGB in the last 72 hours. No results for input(s): WBC, RBC, HCT, PLT in the last 72 hours. No results for input(s): NA, K, CL, CO2, BUN, CREATININE, GLUCOSE, CALCIUM in the last 72 hours. No results for input(s): LABPT, INR in the last 72 hours.  Physical Exam: Neurologically intact ABD soft Intact pulses distally No cellulitis present Compartment soft Body mass index is 34.01 kg/m.   Assessment/Plan: 6 Days Post-Op Procedure(s) (LRB): OPEN REDUCTION INTERNAL FIXATION (ORIF) ANKLE FRACTURE (Right) OPEN REDUCTION INTERNAL FIXATION (ORIF) WRIST FRACTURE (Right) Up with therapy Discharge to SNF  Stable at present Splints in good condition Plan per Dr Aundria Rudogers - await d/c to SNF/placement  Kayden Amend D for Dr. Venita Lickahari Desmin Daleo [facility] [phone] 06/02/2017, 7:32 AM

## 2017-06-02 NOTE — Clinical Social Work Note (Addendum)
CSW spoke with pt advocate, Kepe. [facility] pt's case Monday and make a decision on admission. Per Marrian SalvageKepe [facility] will also be calling her Monday on a decision if they will offer a bed. CSW continuing to follow.   SnellvilleBridget Macaria Bias, [place]CSWA [phone]

## 2017-06-02 NOTE — Progress Notes (Signed)
Patient requesting nicotine patch, RN called PA on call. PA stated it would decrease blood supply and increase infection rate. Pt educated with this information and agreed to staying on nicotine gum.   Patient is requesting for staff to call guardian. RN called guardian and transferred call to patients room.

## 2017-06-03 ENCOUNTER — Encounter (HOSPITAL_COMMUNITY): Payer: Self-pay

## 2017-06-03 LAB — CREATININE, SERUM
Creatinine, Ser: 0.82 mg/dL (ref 0.44–1.00)
GFR calc Af Amer: >60 mL/min (ref >60)
GFR calc non Af Amer: >60 mL/min (ref >60)

## 2017-06-03 MED ORDER — NICOTINE POLACRILEX 2 MG MT GUM
4.0000 mg | CHEWING_GUM | OROMUCOSAL | Status: DC | PRN
  Administered 2017-06-03: 4 mg via ORAL
  Administered 2017-06-03: 2 mg via ORAL
  Administered 2017-06-03: 4 mg via ORAL
  Administered 2017-06-03: 2 mg via ORAL
  Administered 2017-06-03 – 2017-06-05 (×6): 4 mg via ORAL
  Filled 2017-06-03 (×12): qty 2

## 2017-06-03 NOTE — Progress Notes (Signed)
Called pharmacy regarding nicotine gum, stating patient is having one piece per hour.

## 2017-06-04 NOTE — Progress Notes (Signed)
    Subjective: 8 Days Post-Op Procedure(s) (LRB): OPEN REDUCTION INTERNAL FIXATION (ORIF) ANKLE FRACTURE (Right) OPEN REDUCTION INTERNAL FIXATION (ORIF) WRIST FRACTURE (Right) Patient reports pain as 2 on 0-10 scale.   Denies CP or SOB.  Voiding without difficulty. Positive flatus. Objective: Vital signs in last 24 hours: Temp:  [97.8 F (36.6 C)-98.6 F (37 C)] 98.2 F (36.8 C) (06/03 1522) Pulse Rate:  [58-83] 82 (06/03 1522) Resp:  [18] 18 (06/03 1522) BP: (94-108)/(55-66) 94/55 (06/03 1522) SpO2:  [95 %-99 %] 99 % (06/03 1522)  Intake/Output from previous day: 06/02 0701 - 06/03 0700 In: 400 [P.O.:400] Out: 1 [Stool:1] Intake/Output this shift: No intake/output data recorded.  Labs: No results for input(s): HGB in the last 72 hours. No results for input(s): WBC, RBC, HCT, PLT in the last 72 hours. Recent Labs    06/03/17 0744  CREATININE 0.82   No results for input(s): LABPT, INR in the last 72 hours.  Physical Exam: Neurologically intact ABD soft Intact pulses distally No cellulitis present Compartment soft Body mass index is 34.01 kg/m.   Assessment/Plan: 8 Days Post-Op Procedure(s) (LRB): OPEN REDUCTION INTERNAL FIXATION (ORIF) ANKLE FRACTURE (Right) OPEN REDUCTION INTERNAL FIXATION (ORIF) WRIST FRACTURE (Right) Up with therapy [facility]  Stable at present Splints in good condition Pt to be dc'd home tomorrow with guardian.  NWB RLE, PWB to RUE.  Yolonda KidaJason Patrick Adamariz Gillott  [facility] [phone] 06/04/2017, 5:20 PM

## 2017-06-04 NOTE — Progress Notes (Signed)
Physical Therapy Treatment Patient Details Name: Lindsey Ramos MRN: [id number] DOB: 12-29-1995 Today's Date: 06/04/2017    History of Present Illness 22 yo female presented to ED with suicidal ideation and complaints about her living environment. Hx of bipolar d/o and fall on or around 05/14/17. Imaging (+) R trimalleolar ankle fx, R comminuted distal radius fx, and R minimally displaced ulnar fx. S/p ORIF of right of displaced intra-articular distal radius fx and ORIF of right ankle fx on 05/27/17.     PT Comments    Pt practiced ambulation with turns within room with PFRW that she will take at d/c. Performed supine, seated, and standing there ex. Discussed healthy life habits at d/c to allow her injuries to heal but she is craving cigarettes greatly. Pt reports that she will likely d/c to aunt's camper but this will have steep steps to get in which will be difficult with WB'ing status. PT will continue to follow.    Follow Up Recommendations  SNF     Equipment Recommendations  Wheelchair (measurements PT);Rolling walker with 5" wheels;Other (comment)(Rt platform walker )    Recommendations for Other Services       Precautions / Restrictions Precautions Precautions: Fall Required Braces or Orthoses: Other Brace/Splint Other Brace/Splint: splint/cast R arm and R lower leg Restrictions Weight Bearing Restrictions: Yes RUE Weight Bearing: Partial weight bearing RUE Partial Weight Bearing Percentage or Pounds: 50 RLE Weight Bearing: Non weight bearing    Mobility  Bed Mobility Overal bed mobility: Independent Bed Mobility: Supine to Sit;Sit to Supine       Sit to supine: Independent   General bed mobility comments: pt in and out of bed without assist  Transfers Overall transfer level: Modified independent Equipment used: Right platform walker Transfers: Sit to/from Stand Sit to Stand: Modified independent (Device/Increase time)         General transfer comment: pt  stands safely to PFRW, reminder to keep RLE off floor  Ambulation/Gait Ambulation/Gait assistance: Supervision Ambulation Distance (Feet): 20 Feet Assistive device: Right platform walker Gait Pattern/deviations: (Hop-to pattern; pt needs minA for RW management turning Right, Total Assist with RW turning Left) Gait velocity: decreased Gait velocity interpretation: <1.8 ft/sec, indicate of risk for recurrent falls General Gait Details: pt had ambulated in hallway earlier in day so deferred ambulation out of room but agreeable to ambulating within room and into bathroom. Practiced turning 4x. RW arrived that she will take home, let her practice with this one.    Stairs Stairs: (discussed sitting down to go up stairs to aunt's cam[facility])           Wheelchair Mobility    Modified Rankin (Stroke Patients Only)       Balance Overall balance assessment: History of Falls;Modified Independent Sitting-balance support: No upper extremity supported;Feet supported Sitting balance-Leahy Scale: Good     Standing balance support: Bilateral upper extremity supported;During functional activity Standing balance-Leahy Scale: Fair Standing balance comment: pt's balance on LLE has improved throughout visits, can maintain static standing to wash hands without UE support                            Cognition Arousal/Alertness: Awake/alert Behavior During Therapy: WFL for tasks assessed/performed;Agitated(agitated because wanting nicotine gum) Overall Cognitive Status: Within Functional Limits for tasks assessed Area of Impairment: Problem solving                   Current Attention Level: Selective  Following Commands: Follows one step commands consistently;Follows one step commands with increased time   Awareness: Emergent Problem Solving: Slow processing;Requires verbal cues General Comments: discussed d/c plan as well as healthy habits when she d/c's. Pt wants to smoke  again, urged to abstain while her fractures heal      Exercises General Exercises - Lower Extremity Long Arc Quad: AROM;Right;10 reps;Seated Hip ABduction/ADduction: Left;AROM;Standing;10 reps Straight Leg Raises: AROM;Right;5 reps;Seated Other Exercises Other Exercises: SL hip bridges in supine x10 Other Exercises: Rt Hip Extension in standing AROM 1x10, RW for support     General Comments        Pertinent Vitals/Pain Pain Assessment: Faces Faces Pain Scale: Hurts a little bit Pain Location: R UE Pain Descriptors / Indicators: Aching Pain Intervention(s): Monitored during session    Home Living                      Prior Function            PT Goals (current goals can now be found in the care plan section) Acute Rehab PT Goals Patient Stated Goal: walk again and do yoga again PT Goal Formulation: With patient Time For Goal Achievement: 06/09/17 Potential to Achieve Goals: Good Progress towards PT goals: Progressing toward goals    Frequency    Min 3X/week      PT Plan Current plan remains appropriate    Co-evaluation              AM-PAC PT "6 Clicks" Daily Activity  Outcome Measure  Difficulty turning over in bed (including adjusting bedclothes, sheets and blankets)?: None Difficulty moving from lying on back to sitting on the side of the bed? : None Difficulty sitting down on and standing up from a chair with arms (e.g., wheelchair, bedside commode, etc,.)?: A Little Help needed moving to and from a bed to chair (including a wheelchair)?: A Little Help needed walking in hospital room?: A Little Help needed climbing 3-5 steps with a railing? : A Lot 6 Click Score: 19    End of Session   Activity Tolerance: Patient tolerated treatment well;No increased pain;Patient limited by fatigue Patient left: with call bell/phone within reach;in bed Nurse Communication: Mobility status PT Visit Diagnosis: History of falling (Z91.81);Other  abnormalities of gait and mobility (R26.89);Difficulty in walking, not elsewhere classified (R26.2);Pain Pain - Right/Left: Right Pain - part of body: Arm;Ankle and joints of foot     Time: [id number] PT Time Calculation (min) (ACUTE ONLY): 37 min  Charges:  $Gait Training: 8-22 mins $Therapeutic Exercise: 8-22 mins                    G Codes:       Lindsey Ramos, PT  Acute Rehab Services  [phone]    Lindsey Ramos 06/04/2017, 3:22 PM

## 2017-06-04 NOTE — Care Management Note (Signed)
Case Management Note  Patient Details  Name: Lindsey Ramos MRN: [id number] Date of Birth: 05/05/1995  Subjective/Objective:  22 yr old female s/p right wrist and right ankle fracture.   Patient underwent an ORIF of  ORIF of right of displaced intra-articular distal radius fx and ORIF of right ankle fx on 05/27/17.                Action/Plan:  Patient is from a group home, will not return there at discharge. Social worker and patient's advocate have been in constant communication concerning patient's discharge plan., she will be discharge home with family member. Case manager has arranged for Home Health and DME.  Expected Discharge Date:   06/05/17               Expected Discharge Plan:  Home w Home Health Services  In-House Referral:  NA, Clinical Social Work  Discharge planning Services  CM Consult  Post Acute Care Choice:  Durable Medical Equipment, [facility] Health [facility]  DME Arranged:  3-N-1, Walker rolling DME Agency:  [facility]  HH Arranged:  PT HH Agency:  [facility]  Status of Service:  Completed, signed off  If discussed at Long Length of Stay Meetings, dates discussed:    Additional Comments:  Durenda GuthrieBrady, Josian Lanese Naomi, RN 06/04/2017, 2:02 PM

## 2017-06-04 NOTE — Progress Notes (Signed)
Per CM, pt needs face to face, discharge summary and any Rx's for discharge.  Message left in office at [phone] stating above, awaiting return call.  AKingBSNRN

## 2017-06-04 NOTE — Social Work (Addendum)
CSW f/u for disposition.  [facility] and they indicated that they are still considering and will let CSW know if they can offer a SNF bed if patient has good discharge plan.   CSW then called advocate, Keppe and advised that patient will need to discharge today as she is medically ready. Advocate indicated that they have a meeting today and she will call CSW back if patient will be allowed in new placement. CSW advised that patient can return home with mom in the meantime if needed until placement is secured at another group home. Advocate agreed and CSW will f/u.  1:54 pm: CSW called [facility] and they indicated that they cannot offer SNF bed for patient.   CSW then called advocate back who indicated that she will take patient home and we will have RNCM assist with home health physical therapy set up with equipment.  Keene BreathPatricia Judy Pollman, LCSW Clinical Social Worker [phone]

## 2017-06-04 NOTE — Social Work (Addendum)
CSW following up on disposition.  Pt has been hospitalized since 5/24 due to right wrist and right ankle fractures. [facility] and guardian for placement.  Pt will discharge with family 06/05/17 [facility]  Pt will be transported to home via guardian,Keppe, [phone].  Guardian will assist with long term planning and placement in group home setting.  CSW will continue to follow for discharge.  Keene BreathPatricia Aiyanah Kalama, LCSW Clinical Social Worker [phone]

## 2017-06-05 NOTE — Progress Notes (Signed)
Physical Therapy Treatment Patient Details Name: Lindsey Ramos MRN: [id number] DOB: 1995/04/22 Today's Date: 06/05/2017    History of Present Illness 22 yo female presented to ED with suicidal ideation and complaints about her living environment. Hx of bipolar d/o and fall on or around 05/14/17. Imaging (+) R trimalleolar ankle fx, R comminuted distal radius fx, and R minimally displaced ulnar fx. S/p ORIF of right of displaced intra-articular distal radius fx and ORIF of right ankle fx on 05/27/17.     PT Comments    Pt progressing well with mobility. She demonstrates independence with bed mobility and modified independence with transfers. Supervision provided for ambulation 20 feet. Pt to discharge home today with family assist. Discharge recommendations updated.    Follow Up Recommendations  Home health PT;Supervision for mobility/OOB     Equipment Recommendations  Wheelchair (measurements PT);Rolling walker with 5" wheels;Other (comment)(R platform RW)    Recommendations for Other Services       Precautions / Restrictions Precautions Precautions: Fall Required Braces or Orthoses: Other Brace/Splint Other Brace/Splint: splint/cast R arm and R lower leg Restrictions Weight Bearing Restrictions: Yes RUE Weight Bearing: Partial weight bearing RUE Partial Weight Bearing Percentage or Pounds: 50 RLE Weight Bearing: Non weight bearing    Mobility  Bed Mobility Overal bed mobility: Independent                Transfers Overall transfer level: Modified independent Equipment used: Right platform walker Transfers: Sit to/from Stand Sit to Stand: Modified independent (Device/Increase time)            Ambulation/Gait Ambulation/Gait assistance: Supervision Ambulation Distance (Feet): 20 Feet Assistive device: Right platform walker Gait Pattern/deviations: Step-to pattern Gait velocity: decreased Gait velocity interpretation: <1.8 ft/sec, indicate of risk for  recurrent falls General Gait Details: supervision for safety. No physical assist.    Stairs             Wheelchair Mobility    Modified Rankin (Stroke Patients Only)       Balance Overall balance assessment: Mild deficits observed, not formally tested                                          Cognition Arousal/Alertness: Awake/alert Behavior During Therapy: Flat affect Overall Cognitive Status: Within Functional Limits for tasks assessed                                        Exercises      General Comments        Pertinent Vitals/Pain Pain Assessment: Faces Faces Pain Scale: Hurts a little bit Pain Location: R UE, R LE Pain Descriptors / Indicators: Sore Pain Intervention(s): Monitored during session    Home Living                      Prior Function            PT Goals (current goals can now be found in the care plan section) Acute Rehab PT Goals Patient Stated Goal: walk again PT Goal Formulation: With patient Time For Goal Achievement: 06/09/17 Potential to Achieve Goals: Good Progress towards PT goals: Progressing toward goals    Frequency    Min 3X/week      PT Plan Discharge plan needs to be  updated    Co-evaluation              AM-PAC PT "6 Clicks" Daily Activity  Outcome Measure  Difficulty turning over in bed (including adjusting bedclothes, sheets and blankets)?: None Difficulty moving from lying on back to sitting on the side of the bed? : None Difficulty sitting down on and standing up from a chair with arms (e.g., wheelchair, bedside commode, etc,.)?: None Help needed moving to and from a bed to chair (including a wheelchair)?: None Help needed walking in hospital room?: None Help needed climbing 3-5 steps with a railing? : A Lot 6 Click Score: 22    End of Session   Activity Tolerance: Patient tolerated treatment well Patient left: in chair;with call bell/phone within  reach Nurse Communication: Mobility status PT Visit Diagnosis: History of falling (Z91.81);Other abnormalities of gait and mobility (R26.89);Difficulty in walking, not elsewhere classified (R26.2);Pain Pain - Right/Left: Right Pain - part of body: Arm;Ankle and joints of foot     Time: 1020-1029 PT Time Calculation (min) (ACUTE ONLY): 9 min  Charges:  $Gait Training: 8-22 mins                    G Codes:       Lindsey Ramos, PT  Office # [phone] Pager [phone]    Ilda Foil 06/05/2017, 10:35 AM

## 2017-06-05 NOTE — Care Management Important Message (Signed)
Important Message  Patient Details  Name: Lindsey Ramos MRN: [id number] Date of Birth: 07/31/1995   Medicare Important Message Given:  Yes    [name] 06/05/2017, 3:33 PM

## 2017-06-05 NOTE — Care Management Important Message (Signed)
Important Message  Patient Details  Name: Lindsey Ramos MRN: [id number] Date of Birth: 01/07/1995   Medicare Important Message Given:  Yes    Shameek Nyquist Stefan ChurchBratton 06/05/2017, 3:32 PM

## 2017-06-05 NOTE — Progress Notes (Signed)
    Subjective: 9 Days Post-Op Procedure(s) (LRB): OPEN REDUCTION INTERNAL FIXATION (ORIF) ANKLE FRACTURE (Right) OPEN REDUCTION INTERNAL FIXATION (ORIF) WRIST FRACTURE (Right) Patient reports pain as mild  Denies CP or SOB.  Voiding without difficulty. Positive flatus. Objective: Vital signs in last 24 hours: Temp:  [98 F (36.7 C)-98.3 F (36.8 C)] 98.3 F (36.8 C) (06/04 0447) Pulse Rate:  [64-82] 64 (06/04 0447) Resp:  [12-18] 12 (06/04 0447) BP: (91-96)/(54-61) 91/54 (06/04 0447) SpO2:  [99 %] 99 % (06/04 0447)  Intake/Output from previous day: 06/03 0701 - 06/04 0700 In: 720 [P.O.:720] Out: -  Intake/Output this shift: No intake/output data recorded.  Labs: No results for input(s): HGB in the last 72 hours. No results for input(s): WBC, RBC, HCT, PLT in the last 72 hours. Recent Labs    06/03/17 0744  CREATININE 0.82   No results for input(s): LABPT, INR in the last 72 hours.  Physical Exam: Neurologically intact ABD soft Intact pulses distally No cellulitis present Compartment soft Body mass index is 34.01 kg/m.   Assessment/Plan: 9 Days Post-Op Procedure(s) (LRB): OPEN REDUCTION INTERNAL FIXATION (ORIF) ANKLE FRACTURE (Right) OPEN REDUCTION INTERNAL FIXATION (ORIF) WRIST FRACTURE (Right) Up with therapy [facility]  NWB RLE, PWB to RUE.  Lindsey Ramos  [facility] [phone] 06/05/2017, 7:53 AM

## 2017-06-05 NOTE — Progress Notes (Signed)
Occupational Therapy Treatment Patient Details Name: Lindsey Ramos MRN: [id number] DOB: 1995/10/05 Today's Date: 06/05/2017    History of present illness 22 yo female presented to ED with suicidal ideation and complaints about her living environment. Hx of bipolar d/o and fall on or around 05/14/17. Imaging (+) R trimalleolar ankle fx, R comminuted distal radius fx, and R minimally displaced ulnar fx. S/p ORIF of right of displaced intra-articular distal radius fx and ORIF of right ankle fx on 05/27/17.    OT comments  Pt overall supervision to mod I for ADL and functional mobility today. Able to perform safe transfers with use of R PFRW. Educated pt on tub transfer technique with use of 3 in 1 and safety strategies with bathing. Pt planning to d/c home now with supervision from her aunt; updated [facility] plan to [facility] for follow up. Will continue to follow acutely.   Follow Up Recommendations  Home health OT;Supervision/Assistance - 24 hour    Equipment Recommendations  3 in 1 bedside commode    Recommendations for Other Services      Precautions / Restrictions Precautions Precautions: Fall Required Braces or Orthoses: Other Brace/Splint Other Brace/Splint: splint/cast R arm and R lower leg Restrictions Weight Bearing Restrictions: Yes RUE Weight Bearing: Partial weight bearing RUE Partial Weight Bearing Percentage or Pounds: 50 RLE Weight Bearing: Non weight bearing       Mobility Bed Mobility Overal bed mobility: Independent                Transfers Overall transfer level: Modified independent Equipment used: Right platform walker Transfers: Sit to/from Stand Sit to Stand: Modified independent (Device/Increase time)              Balance Overall balance assessment: History of Falls;Modified Independent                                         ADL either performed or assessed with clinical judgement   ADL Overall ADL's : Needs  assistance/impaired     Grooming: Modified independent;Standing;Wash/dry hands               Lower Body Dressing: Supervision/safety;Sit to/from stand   Toilet Transfer: Supervision/safety;Ambulation;Regular Toilet;RW   Toileting- Clothing Manipulation and Hygiene: Modified independent;Sit to/from Nurse, children's Details (indicate cue type and reason): Educated pt on safe tub transfer technqiue with use of 3 in 1. Discussed keeping dressing dry on RUE/LE using plastic bag and tape. Also discussed sponge bathing initially for safety. Functional mobility during ADLs: Supervision/safety;Rolling walker       Vision       Perception     Praxis      Cognition Arousal/Alertness: Awake/alert Behavior During Therapy: Flat affect Overall Cognitive Status: Within Functional Limits for tasks assessed                                          Exercises     Shoulder Instructions       General Comments      Pertinent Vitals/ Pain       Pain Assessment: Faces Faces Pain Scale: Hurts a little bit Pain Location: R UE, R LE Pain Descriptors / Indicators: Discomfort Pain Intervention(s): Monitored during session;Repositioned  Home Living  Prior Functioning/Environment              Frequency  Min 3X/week        Progress Toward Goals  OT Goals(current goals can now be found in the care plan section)  Progress towards OT goals: Progressing toward goals  Acute Rehab OT Goals Patient Stated Goal: walk again OT Goal Formulation: With patient ADL Goals Pt Will Perform Tub/Shower Transfer: Tub transfer;with supervision;ambulating;3 in 1;rolling walker  Plan Discharge plan needs to be updated    Co-evaluation                 AM-PAC PT "6 Clicks" Daily Activity     Outcome Measure   Help from another person eating meals?: None Help from another person taking care of  personal grooming?: None Help from another person toileting, which includes using toliet, bedpan, or urinal?: A Little Help from another person bathing (including washing, rinsing, drying)?: A Little Help from another person to put on and taking off regular upper body clothing?: None Help from another person to put on and taking off regular lower body clothing?: A Little 6 Click Score: 21    End of Session Equipment Utilized During Treatment: Rolling walker  OT Visit Diagnosis: Unsteadiness on feet (R26.81);Other abnormalities of gait and mobility (R26.89);Muscle weakness (generalized) (M62.81);Pain Pain - Right/Left: Right Pain - part of body: Arm;Hand;Ankle and joints of foot;Leg   Activity Tolerance Patient tolerated treatment well   Patient Left in chair;with call bell/phone within reach   Nurse Communication          Time: [id number] OT Time Calculation (min): 14 min  Charges: OT General Charges $OT Visit: 1 Visit OT Treatments $Self Care/Home Management : 8-22 mins  Lindsey Ramos A. Brett Albinooffey, M.S., OTR/L Acute Rehab Department: [phone]    Gaye AlkenBailey A Jersey Ramos 06/05/2017, 10:11 AM

## 2017-06-05 NOTE — Progress Notes (Signed)
Pt discharged in stable condition, all discharge instructions and Rx's x1 reviewed with pt and guardian, envelope given to guardian Kepe.  Pt transported via wheelchair by this nurse, all belongings sent with pt and guardian.  AKingBSNRN

## 2017-06-07 NOTE — Care Management (Signed)
06/07/17 9:00 am  Case manager was notified by [facility] Liaison that they do not service [place].  [facility] manager [facility]. Faxed [facility], [facility]: [phone]. Start of care will be either 06/08/17 or 06/09/17. Case manager notified Abran RichardKeppe, patient's advocate [phone].

## 2017-06-14 NOTE — Discharge Summary (Signed)
Patient ID: Lindsey Ramos MRN: [id number] DOB/AGE: Nov 11, 1995 22 y.o.  Admit date: 05/25/2017 Discharge date: 06/05/2017  Primary Diagnosis: Right distal radius and right ankle fracture  Admission Diagnoses:  Past Medical History:  Diagnosis Date  . Bipolar affective disorder (HCC)   . Sleep disorder    Discharge Diagnoses:   Principal Problem:   Closed displaced trimalleolar fracture of right ankle Active Problems:   Closed fracture of right distal radius  Estimated body mass index is 34.01 kg/m as calculated from the following:   Height as of this encounter: 5\' 1"  (1.549 m).   Weight as of this encounter: 81.6 kg (180 lb).  Procedure:  Procedure(s) (LRB): OPEN REDUCTION INTERNAL FIXATION (ORIF) ANKLE FRACTURE (Right) OPEN REDUCTION INTERNAL FIXATION (ORIF) WRIST FRACTURE (Right)   Consults: None  HPI: Lindsey Ramos presented to the [facility] emergency department for psychiatric disturbance.  She had known right distal radius and right ankle fractures.  We were having a hard time getting her scheduled for surgery due to living in a group home.  Once notified by the emergency department and she was cleared from a psychiatric standpoint we had her transferred to [facility] where distal radius fracture was fixed by Dr. Orlan Leavensrtman in the right ankle by myself. Laboratory Data:    X-Rays:Ct Ankle Right Wo Contrast  Result Date: 05/24/2017 CLINICAL DATA:  Pretreatment evaluation for a patient who suffered right ankle fractures due to a fall down stairs 05/14/2017. Initial encounter. EXAM: CT OF THE RIGHT ANKLE WITHOUT CONTRAST TECHNIQUE: Multidetector CT imaging of the right ankle was performed according to the standard protocol. Multiplanar CT image reconstructions were also generated. COMPARISON:  Plain films the right ankle 05/14/2017. FINDINGS: Bones/Joint/Cartilage As seen on the comparison plain films, the patient has a trimalleolar fracture of the left ankle. The posterior malleolar  fragment includes the entire transverse dimension of the tibia measuring 2.6 cm transverse by 1 cm AP at the plafond. This fragment is up to 1.3 cm craniocaudal. It is superiorly displaced 0.4 cm at the plafond. The medial malleolar fracture fragment is laterally displaced 0.6 cm and minimally comminuted. Distal fibular fracture originates 5.5 cm above the tip of the lateral malleolus in the posterior cortex and extends in anterior and inferior orientation through the anterior cortex of the fibula 1.3 cm above the tip of the lateral malleolus. The distal fragment is posteriorly displaced 0.8 cm. The posterior aspect of the syndesmosis is widened. The fibular shaft fragment includes the expected location of the anterior, inferior tibiofibular ligament is normally positioned. The medial talar dome is inferiorly tilted approximately 20 degrees. No other fracture is identified. Small accessory ossicle of the navicular is noted. Ligaments Suboptimally assessed by CT. Muscles and Tendons No tendon entrapment is identified.  Tendons appear intact. Soft tissues Soft tissue edema and contusion are seen about the patient's fracture. IMPRESSION: Trimalleolar fracture and findings most consistent with syndesmotic injury as described above. Electronically Signed   By: Drusilla Kannerhomas  Dalessio M.D.   On: 05/24/2017 08:52   Ct Wrist Right Wo Contrast  Result Date: 05/24/2017 CLINICAL DATA:  Status post fall down the stairs. Wrist pain and fracture. EXAM: CT OF THE RIGHT WRIST WITHOUT CONTRAST TECHNIQUE: Multidetector CT imaging of the right wrist was performed according to the standard protocol. Multiplanar CT image reconstructions were also generated. COMPARISON:  None. FINDINGS: Bones/Joint/Cartilage Acute comminuted fracture of the distal radial metaphysis extending into the epiphysis and involving the articular surface of the radiocarpal joint. 3 mm volar  displacement. 3 mm distraction of the articular surface fracture cleft.  Minimally displaced fracture of the base of the ulnar styloid process. No other acute fracture or dislocation. Overall alignment is otherwise anatomic. No aggressive osseous lesion. Joint spaces are maintained. Ligaments Suboptimally assessed by CT. Muscles and Tendons Muscles are normal. Flexor and extensor compartment tendons are grossly intact. Soft tissues No fluid collection or hematoma. Mild surrounding soft tissue edema. IMPRESSION: 1. Acute comminuted fracture of the distal radial metaphysis extending into the epiphysis and involving the articular surface of the radiocarpal joint. 2. Minimally displaced fracture of the base of the ulnar styloid process. Electronically Signed   By: Elige Ko   On: 05/24/2017 08:47    EKG: Orders placed or performed in visit on 05/27/17  . EKG 12-Lead  . EKG 12-Lead  . EKG 12-Lead     Hospital Course: Lindsey Ramos is a 22 y.o. who was admitted to Hospital. They were brought to the operating room on 05/27/2017 and underwent Procedure(s): OPEN REDUCTION INTERNAL FIXATION (ORIF) ANKLE FRACTURE OPEN REDUCTION INTERNAL FIXATION (ORIF) WRIST FRACTURE.  Patient tolerated the procedure well and was later transferred to the recovery room and then to the orthopaedic floor for postoperative care.  They were given PO and IV analgesics for pain control following their surgery.  They were given 24 hours of postoperative antibiotics of  Anti-infectives (From admission, onward)   Start     Dose/Rate Route Frequency Ordered Stop   05/28/17 0600  ceFAZolin (ANCEF) IVPB 2g/100 mL premix     2 g 200 mL/hr over 30 Minutes Intravenous On call to O.R. 05/27/17 1636 05/29/17 0559   05/27/17 1400  ceFAZolin (ANCEF) IVPB 1 g/50 mL premix     1 g 100 mL/hr over 30 Minutes Intravenous Every 6 hours 05/27/17 1224 05/28/17 0415     and started on DVT prophylaxis in the form of Lovenox.   PT and OT were ordered.  Discharge planning consulted to help with postop disposition and  equipment needs.  During her stay on the floor had attempted to place her in a inpatient rehab facility however cannot achieve approval for that.  Therefore she was set up with home health physical and occupational therapy. Patient was seen in rounds and was ready to go home.   Diet: Regular diet Activity:Platform weightbearing to right upper extremity.  Nonweightbearing to the right lower extremity. Follow-up:in 2 weeks Disposition - Home Discharged Condition: good   Discharge Instructions    Call MD / Call 911   Complete by:  As directed    If you experience chest pain or shortness of breath, CALL 911 and be transported to the hospital emergency room.  If you develope a fever above 101 F, pus (white drainage) or increased drainage or redness at the wound, or calf pain, call your surgeon's office.   Constipation Prevention   Complete by:  As directed    Drink plenty of fluids.  Prune juice may be helpful.  You may use a stool softener, such as Colace (over the counter) 100 mg twice a day.  Use MiraLax (over the counter) for constipation as needed.   Diet - low sodium heart healthy   Complete by:  As directed    Increase activity slowly as tolerated   Complete by:  As directed      Allergies as of 06/05/2017   No Known Allergies     Medication List    TAKE these medications  HYDROcodone-acetaminophen 5-325 MG tablet Commonly known as:  NORCO Take 1-2 tablets by mouth every 4 (four) hours as needed for moderate pain or severe pain. What changed:    when to take this  reasons to take this   INVEGA 1.5 MG Tb24 Generic drug:  Paliperidone Take 6 mg by mouth at bedtime.   INVEGA SUSTENNA 234 MG/1.5ML Susy injection Generic drug:  paliperidone Inject 234 mg into the muscle every 30 (thirty) days.   mirtazapine 30 MG tablet Commonly known as:  REMERON Take 30 mg by mouth at bedtime.   ondansetron 4 MG disintegrating tablet Commonly known as:  ZOFRAN ODT Take 1 tablet (4  mg total) by mouth every 8 (eight) hours as needed.   sertraline 100 MG tablet Commonly known as:  ZOLOFT Take 100 mg by mouth daily.      Follow-up Information    Yolonda Kida, MD In 2 weeks.   Specialty:  Orthopedic Surgery Why:  For suture removal, For wound re-check Contact information: [address] [place] [place] [address] [phone]           Signed: Maryan Rued, MD Orthopaedic Surgery 06/14/2017, 5:12 PM

## 2018-10-18 IMAGING — CT CT ANKLE*R* W/O CM
1 series · 12 of 14 positions shown, 15 images · non-contrast
Comparison: Plain films the right ankle 05/14/2017.

CLINICAL DATA: Pretreatment evaluation for a patient who suffered
right ankle fractures due to a fall down stairs 05/14/2017. Initial
encounter.

EXAM:
CT OF THE RIGHT ANKLE WITHOUT CONTRAST
TECHNIQUE: Multidetector CT imaging of the right ankle was performed according
to the standard protocol. Multiplanar CT image reconstructions were
also generated.

[Series 4: soft tissue lower extremity · axial · 0.30mm/px · z∈[-198,-14]mm · 12 of 110 slices shown, 15 images]
[im 9/110  soft-tissue]
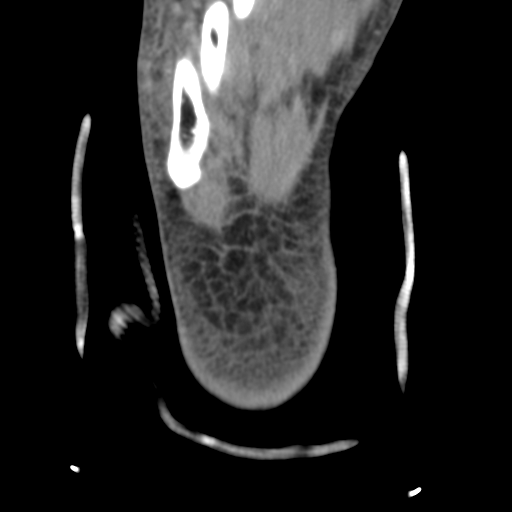
[im 9/110  bone]
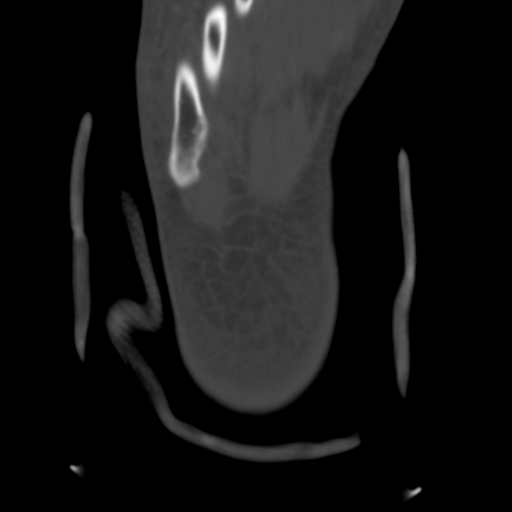
[im 17/110  bone]
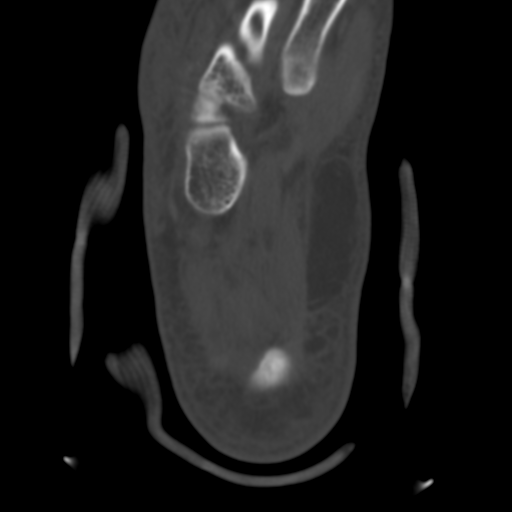
[im 26/110  bone]
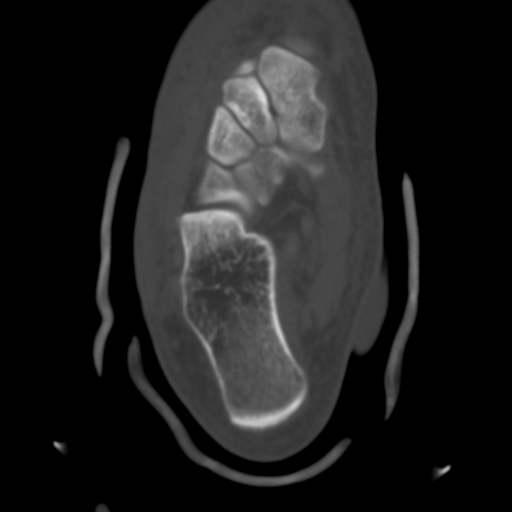
[im 34/110  bone]
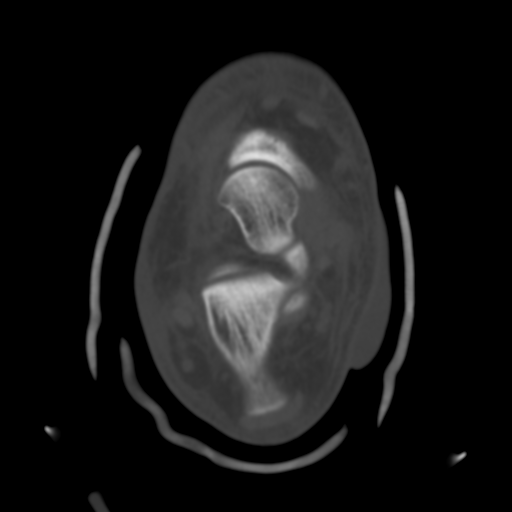
[im 42/110  soft-tissue]
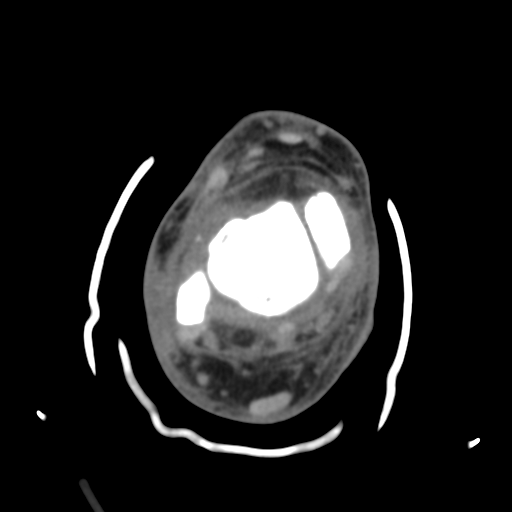
[im 42/110  bone]
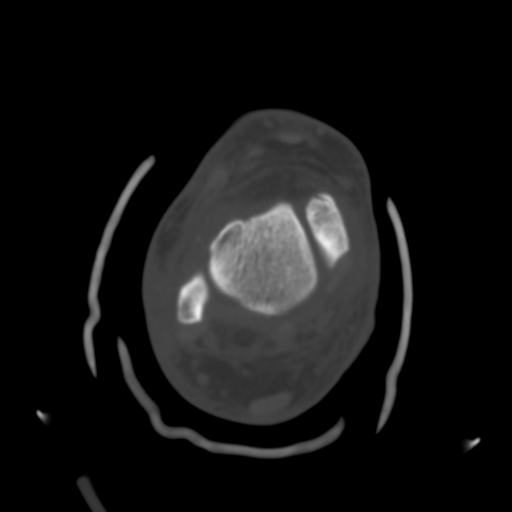
[im 51/110  bone]
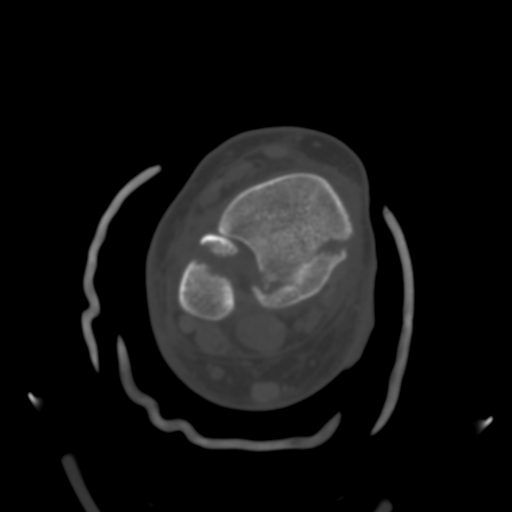
[im 59/110  bone]
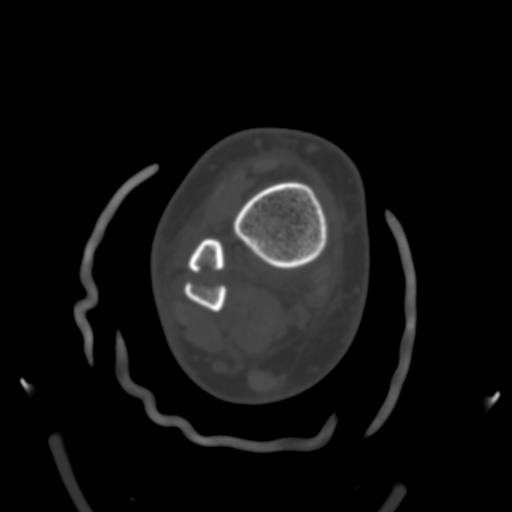
[im 68/110  bone]
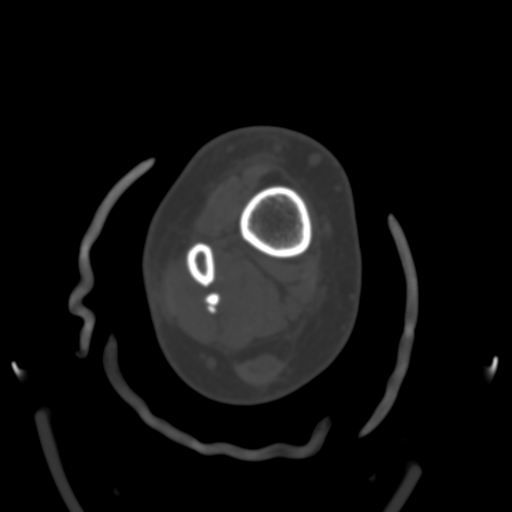
[im 76/110  soft-tissue]
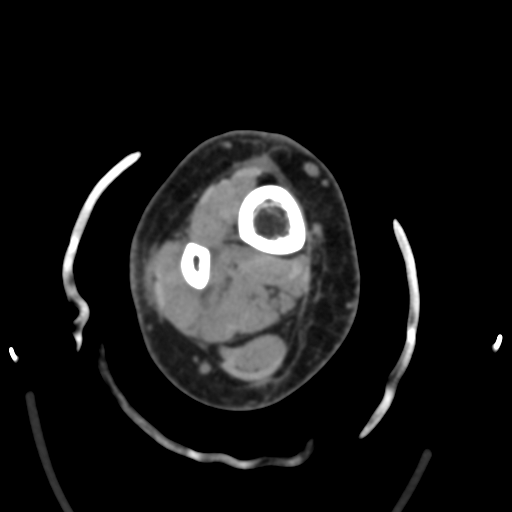
[im 76/110  bone]
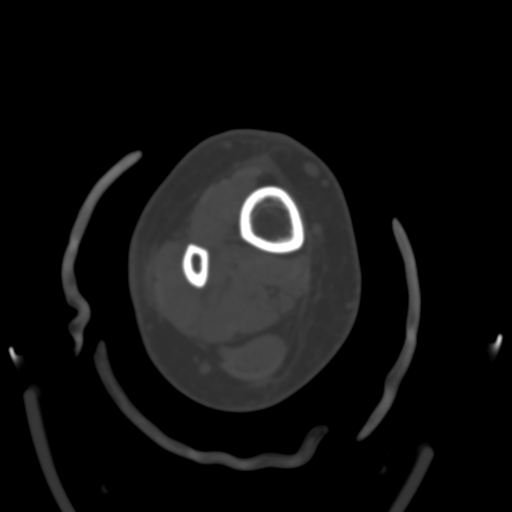
[im 84/110  bone]
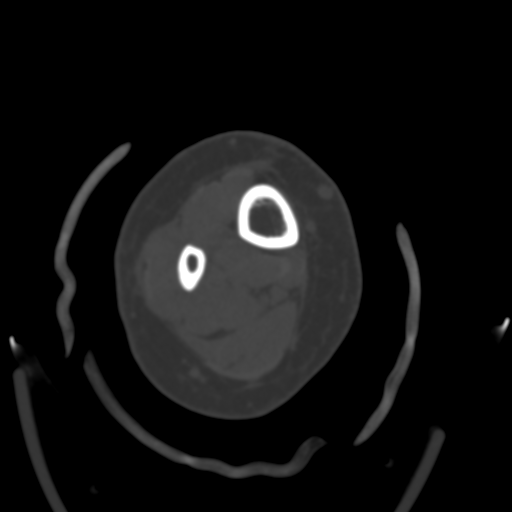
[im 93/110  bone]
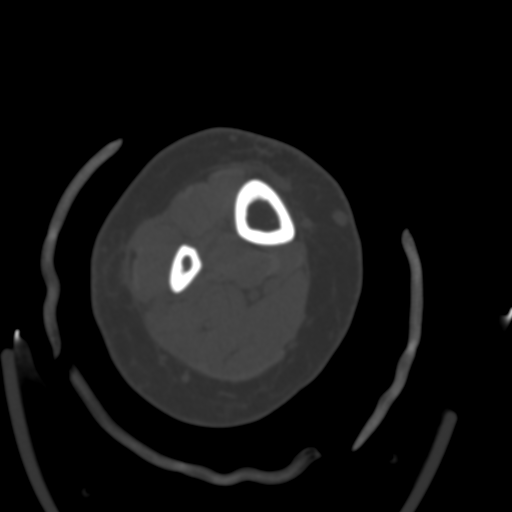
[im 101/110  bone]
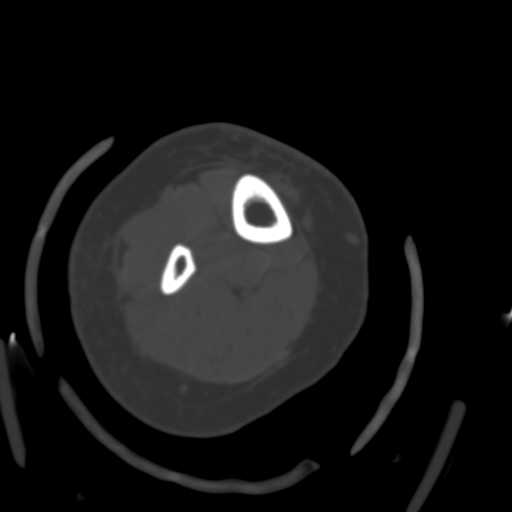

[12 of 14 positions shown; findings below may reference images not displayed]

FINDINGS: Bones/Joint/Cartilage

As seen on the comparison plain films, the patient has a
trimalleolar fracture of the left ankle. The posterior malleolar
fragment includes the entire transverse dimension of the tibia
measuring 2.6 cm transverse by 1 cm AP at the plafond. This fragment
is up to 1.3 cm craniocaudal. It is superiorly displaced 0.4 cm at
the plafond.

The medial malleolar fracture fragment is laterally displaced 0.6 cm
and minimally comminuted.

Distal fibular fracture originates 5.5 cm above the tip of the
lateral malleolus in the posterior cortex and extends in anterior
and inferior orientation through the anterior cortex of the fibula
1.3 cm above the tip of the lateral malleolus. The distal fragment
is posteriorly displaced 0.8 cm.

The posterior aspect of the syndesmosis is widened. The fibular
shaft fragment includes the expected location of the anterior,
inferior tibiofibular ligament is normally positioned.

The medial talar dome is inferiorly tilted approximately 20 degrees.
No other fracture is identified. Small accessory ossicle of the
navicular is noted.

Ligaments

Suboptimally assessed by CT.

Muscles and Tendons

No tendon entrapment is identified.  Tendons appear intact.

Soft tissues

Soft tissue edema and contusion are seen about the patient's
fracture.
IMPRESSION: Trimalleolar fracture and findings most consistent with syndesmotic
injury as described above.
